# Patient Record
Sex: Male | Born: 1950 | Race: White | Hispanic: No | Marital: Married | Smoking: Former smoker
Health system: Southern US, Community
[De-identification: ages and names within clinical notes are randomized; demographics above are authoritative.]

## PROBLEM LIST (undated history)

## (undated) DIAGNOSIS — N138 Other obstructive and reflux uropathy: Secondary | ICD-10-CM

## (undated) DIAGNOSIS — Z125 Encounter for screening for malignant neoplasm of prostate: Secondary | ICD-10-CM

## (undated) DIAGNOSIS — R454 Irritability and anger: Secondary | ICD-10-CM

## (undated) DIAGNOSIS — N401 Enlarged prostate with lower urinary tract symptoms: Secondary | ICD-10-CM

## (undated) HISTORY — DX: Benign prostatic hyperplasia with lower urinary tract symptoms: N40.1

## (undated) HISTORY — DX: Irritability and anger: R45.4

## (undated) HISTORY — DX: Other obstructive and reflux uropathy: N13.8

---
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
## 1997-05-29 HISTORY — PX: INGUINAL HERNIA REPAIR: SUR1180

## 2009-05-29 HISTORY — PX: SPIROMETRY: SHX456

## 2014-07-02 LAB — BASIC METABOLIC PANEL
BUN: 12 (ref 4–21)
Creatinine: 0.8 (ref 0.6–1.3)
Glucose: 91
Potassium: 4.4 (ref 3.4–5.3)
Sodium: 140 (ref 137–147)

## 2014-07-02 LAB — COMPREHENSIVE METABOLIC PANEL
Albumin: 4.2 (ref 3.5–5.0)
Calcium: 9.2 (ref 8.7–10.7)
Globulin: 3.3

## 2014-07-02 LAB — HEPATIC FUNCTION PANEL
ALT: 25 (ref 10–40)
AST: 25 (ref 14–40)
Alkaline Phosphatase: 47 (ref 25–125)
Bilirubin, Total: 0.7

## 2014-07-02 LAB — LIPID PANEL
Cholesterol: 217 — AB (ref 0–200)
Triglycerides: 98 (ref 40–160)

## 2014-09-21 HISTORY — PX: COLONOSCOPY: SHX174

## 2014-09-21 LAB — HM COLONOSCOPY

## 2019-03-30 DIAGNOSIS — E7800 Pure hypercholesterolemia, unspecified: Secondary | ICD-10-CM

## 2019-03-30 HISTORY — DX: Pure hypercholesterolemia, unspecified: E78.00

## 2019-04-03 ENCOUNTER — Encounter: Payer: Self-pay | Admitting: Family Medicine

## 2019-04-03 ENCOUNTER — Ambulatory Visit (INDEPENDENT_AMBULATORY_CARE_PROVIDER_SITE_OTHER): Payer: Medicare Other | Admitting: Family Medicine

## 2019-04-03 ENCOUNTER — Other Ambulatory Visit: Payer: Self-pay

## 2019-04-03 VITALS — BP 114/76 | HR 76 | Temp 98.3°F | Resp 16 | Ht 68.0 in | Wt 172.8 lb

## 2019-04-03 DIAGNOSIS — Z1211 Encounter for screening for malignant neoplasm of colon: Secondary | ICD-10-CM

## 2019-04-03 DIAGNOSIS — R454 Irritability and anger: Secondary | ICD-10-CM

## 2019-04-03 DIAGNOSIS — Z0000 Encounter for general adult medical examination without abnormal findings: Secondary | ICD-10-CM

## 2019-04-03 DIAGNOSIS — Z23 Encounter for immunization: Secondary | ICD-10-CM

## 2019-04-03 DIAGNOSIS — Z125 Encounter for screening for malignant neoplasm of prostate: Secondary | ICD-10-CM

## 2019-04-03 DIAGNOSIS — F411 Generalized anxiety disorder: Secondary | ICD-10-CM | POA: Diagnosis not present

## 2019-04-03 MED ORDER — FLUOXETINE HCL 10 MG PO CAPS: 10.0000 mg | ORAL_CAPSULE | Freq: Every day | ORAL | 0 refills | Status: DC

## 2019-04-03 NOTE — Patient Instructions (Signed)

## 2019-04-03 NOTE — Progress Notes (Signed)
Office Note 04/03/2019  CC:  Chief Complaint  Patient presents with  . Establish Care    Previous PCP in [place]    HPI:  Shannon King is a 68 y.o. male who is here to establish care, discuss anxiety/anger control problems, and get annual health maintenance exam. Patient's most recent primary MD: Dr. Lily KocherBayne French in [place]. Old records were not available for review prior to or during today's visit.  Exercise: lots of exercise in yard, walks a lot on job->part time at [employer]. Diet: healthy.  C/o years of problems with "letting little things bother me a lot, holding it in and then it explodes in an angry argument with someone-usually his wife.  NO violence.  He gets irritable pretty easy.  Reports some problems with excessive worry, some guilt, some periods of feeling down but no clinical depression. Sleep is fine, appetite is fine.  Has never been on meds for anx or dep in the past. No hypomanic or manic sx's.   Past Medical History:  Diagnosis Date  . 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 COLONOSCOPY  2017   ? polypectomy->no records available  . INGUINAL HERNIA REPAIR  1999   bilat  . TONSILLECTOMY AND ADENOIDECTOMY  1965    Family History  Problem Relation Age of Onset  . Leukemia Mother   . Diabetes Mother   . Hearing loss Mother   . Hearing loss Father   . Stroke Father     Social History   Socioeconomic History  . Marital status: Married    Spouse name: Not on file  . Number of children: Not on file  . Years of education: Not on file  . Highest education level: Not on file  Occupational History  . Not on file  Social Needs  . Financial resource strain: Not on file  . Food insecurity    Worry: Not on file    Inability: Not on file  . Transportation needs    Medical: Not on file    Non-medical: Not on file  Tobacco Use  . Smoking  status: Former Smoker    Types: Cigarettes    Quit date: 04/02/2012    Years since quitting: 7.0  . Smokeless tobacco: Never Used  Substance and Sexual Activity  . Alcohol use: Not on file  . Drug use: Never  . Sexual activity: Not on file  Lifestyle  . Physical activity    Days per week: Not on file    Minutes per session: Not on file  . Stress: Not on file  Relationships  . Social Musicianconnections    Talks on phone: Not on file    Gets together: Not on file    Attends religious service: Not on file    Active member of club or organization: Not on file    Attends meetings of clubs or organizations: Not on file    Relationship status: Not on file  . Intimate partner violence    Fear of current or ex partner: Not on file    Emotionally abused: Not on file    Physically abused: Not on file    Forced sexual activity: Not on file  Other Topics Concern  . Not on file  Social History Narrative   Married, no children.   Relocated to [place] from [place] 2019.     Educ: 2  yrs college   Occup: "[employer].  Lived in [place] area in 1990s->helped build [employer] [address].   Former smoker: 20 pack-yr hx, quit 2013.   Alcohol: 2 beers per day, rare liquor.       Outpatient Encounter Medications as of 04/03/2019  Medication Sig  . Multiple Vitamins-Minerals (EMERGEN-C IMMUNE PO) Take 1,000 mg by mouth every other day.  . Omega-3 Fatty Acids (FISH OIL) 1000 MG CAPS Take by mouth daily.  . Saw Palmetto 450 MG CAPS Take by mouth 2 (two) times daily.  Marland Kitchen VITAMIN D PO Take 125 mcg by mouth daily.  Marland Kitchen FLUoxetine (PROZAC) 10 MG capsule Take 1 capsule (10 mg total) by mouth daily.   No facility-administered encounter medications on file as of 04/03/2019.     No Known Allergies  ROS Review of Systems  Constitutional: Negative for appetite change, chills, fatigue and fever.  HENT: Negative for congestion, dental problem, ear pain and sore throat.   Eyes: Negative  for discharge, redness and visual disturbance.  Respiratory: Negative for cough, chest tightness, shortness of breath and wheezing.   Cardiovascular: Negative for chest pain, palpitations and leg swelling.  Gastrointestinal: Negative for abdominal pain, blood in stool, diarrhea, nausea and vomiting.  Genitourinary: Negative for difficulty urinating, dysuria, flank pain, frequency, hematuria and urgency.  Musculoskeletal: Negative for arthralgias, back pain, joint swelling, myalgias and neck stiffness.  Skin: Negative for pallor and rash.  Neurological: Negative for dizziness, speech difficulty, weakness and headaches.  Hematological: Negative for adenopathy. Does not bruise/bleed easily.  Psychiatric/Behavioral: Negative for confusion and sleep disturbance. The patient is not nervous/anxious.     PE; Blood pressure 114/76, pulse 76, temperature 98.3 F (36.8 C), temperature source Temporal, resp. rate 16, height 5\' 8"  (1.727 m), weight 172 lb 12.8 oz (78.4 kg), SpO2 97 %. Body mass index is 26.27 kg/m.  Gen: Alert, well appearing.  Patient is oriented to person, place, time, and situation. AFFECT: pleasant, lucid thought and speech. ENT: Ears: EACs clear, normal epithelium.  TMs with good light reflex and landmarks bilaterally.  Eyes: no injection, icteris, swelling, or exudate.  EOMI, PERRLA. Nose: no drainage or turbinate edema/swelling.  No injection or focal lesion.  Mouth: lips without lesion/swelling.  Oral mucosa pink and moist.  Dentition intact and without obvious caries or gingival swelling.  Oropharynx without erythema, exudate, or swelling.  Neck: supple/nontender.  No LAD, mass, or TM.  Carotid pulses 2+ bilaterally, without bruits. CV: RRR, no m/r/g.   LUNGS: CTA bilat, nonlabored resps, good aeration in all lung fields. ABD: soft, NT, ND, BS normal.  No hepatospenomegaly or mass.  No bruits. EXT: no clubbing, cyanosis, or edema.  Musculoskeletal: no joint swelling, erythema,  warmth, or tenderness.  ROM of all joints intact. Skin - no sores or suspicious lesions or rashes or color changes Rectal: pt declined  Pertinent labs:  none  ASSESSMENT AND PLAN:   New pt; obtain prior pcp records.  1) GAD with anger control problems: start fluoxetine 10mg  qd and titrate up slowly. Therapeutic expectations and side effect profile of medication discussed today.  Patient's questions answered.  2)Health maintenance exam: Reviewed age and gender appropriate health maintenance issues (prudent diet, regular exercise, health risks of tobacco and excessive alcohol, use of seatbelts, fire alarms in home, use of sunscreen).  Also reviewed age and gender appropriate health screening as well as vaccine recommendations. Vaccines: flu vaccine today.  Determine future vaccine needs based on  what his old records have in them. Labs: fasting HP labs + PSA ordered-future when fasting. Prostate ca screening: he declined DRE today.  PSA-future. Colon ca screening: colonoscopy UTD.  Will see if his old records have anything to suggest he needs recall earlier than 10 yrs.  He is in favor of stool based testing instead of colonoscopy in future.  An After Visit Summary was printed and given to the patient.  Return in about 4 weeks (around 05/01/2019) for f/u anx/anger control probs.  Signed:  Crissie Sickles, MD           04/03/2019

## 2019-04-09 ENCOUNTER — Encounter: Payer: Self-pay | Admitting: Family Medicine

## 2019-04-11 ENCOUNTER — Ambulatory Visit (INDEPENDENT_AMBULATORY_CARE_PROVIDER_SITE_OTHER): Payer: Medicare Other | Admitting: Family Medicine

## 2019-04-11 ENCOUNTER — Other Ambulatory Visit: Payer: Self-pay

## 2019-04-11 DIAGNOSIS — Z0000 Encounter for general adult medical examination without abnormal findings: Secondary | ICD-10-CM

## 2019-04-11 NOTE — Addendum Note (Signed)
Addended by: Aashrith Eves A on: 04/11/2019 02:52 PM   Modules accepted: Orders  

## 2019-04-11 NOTE — Addendum Note (Signed)
Addended by: Cheyane Ayon A on: 04/11/2019 02:52 PM   Modules accepted: Orders  

## 2019-04-11 NOTE — Addendum Note (Signed)
Addended by: Marlene Bast A on: 04/11/2019 02:52 PM   Modules accepted: Orders

## 2019-04-11 NOTE — Addendum Note (Signed)
Addended by: Rashana Andrew A on: 04/11/2019 02:52 PM   Modules accepted: Orders  

## 2019-04-12 LAB — CBC WITH DIFFERENTIAL/PLATELET
Absolute Monocytes: 623 cells/uL (ref 200–950)
Basophils Absolute: 49 cells/uL (ref 0–200)
Basophils Relative: 0.6 %
Eosinophils Absolute: 197 cells/uL (ref 15–500)
Eosinophils Relative: 2.4 %
HCT: 41.7 % (ref 38.5–50.0)
Hemoglobin: 14 g/dL (ref 13.2–17.1)
Lymphs Abs: 3173 cells/uL (ref 850–3900)
MCH: 30.5 pg (ref 27.0–33.0)
MCHC: 33.6 g/dL (ref 32.0–36.0)
MCV: 90.8 fL (ref 80.0–100.0)
MPV: 9.3 fL (ref 7.5–12.5)
Monocytes Relative: 7.6 %
Neutro Abs: 4157 cells/uL (ref 1500–7800)
Neutrophils Relative %: 50.7 %
Platelets: 262 10*3/uL (ref 140–400)
RBC: 4.59 10*6/uL (ref 4.20–5.80)
RDW: 11.9 % (ref 11.0–15.0)
Total Lymphocyte: 38.7 %
WBC: 8.2 10*3/uL (ref 3.8–10.8)

## 2019-04-12 LAB — COMPREHENSIVE METABOLIC PANEL
AG Ratio: 1.9 (calc) (ref 1.0–2.5)
ALT: 12 U/L (ref 9–46)
AST: 15 U/L (ref 10–35)
Albumin: 4.2 g/dL (ref 3.6–5.1)
Alkaline phosphatase (APISO): 32 U/L — ABNORMAL LOW (ref 35–144)
BUN: 14 mg/dL (ref 7–25)
CO2: 26 mmol/L (ref 20–32)
Calcium: 9 mg/dL (ref 8.6–10.3)
Chloride: 105 mmol/L (ref 98–110)
Creat: 0.8 mg/dL (ref 0.70–1.25)
Globulin: 2.2 g/dL (calc) (ref 1.9–3.7)
Glucose, Bld: 85 mg/dL (ref 65–99)
Potassium: 4 mmol/L (ref 3.5–5.3)
Sodium: 139 mmol/L (ref 135–146)
Total Bilirubin: 1.1 mg/dL (ref 0.2–1.2)
Total Protein: 6.4 g/dL (ref 6.1–8.1)

## 2019-04-12 LAB — LIPID PANEL
Cholesterol: 177 mg/dL (ref <200)
HDL: 44 mg/dL (ref >=40)
LDL Cholesterol (Calc): 119 mg/dL (calc) — ABNORMAL HIGH
Non-HDL Cholesterol (Calc): 133 mg/dL (calc) — ABNORMAL HIGH (ref <130)
Total CHOL/HDL Ratio: 4 (calc) (ref <5.0)
Triglycerides: 57 mg/dL (ref <150)

## 2019-04-12 LAB — TSH: TSH: 1.14 mIU/L (ref 0.40–4.50)

## 2019-04-14 ENCOUNTER — Encounter: Payer: Self-pay | Admitting: Family Medicine

## 2019-04-16 ENCOUNTER — Encounter: Payer: Self-pay | Admitting: Family Medicine

## 2019-04-27 ENCOUNTER — Encounter: Payer: Self-pay | Admitting: Family Medicine

## 2019-04-29 DEATH — deceased

## 2019-05-05 ENCOUNTER — Encounter: Payer: Self-pay | Admitting: Family Medicine

## 2019-05-05 ENCOUNTER — Ambulatory Visit (INDEPENDENT_AMBULATORY_CARE_PROVIDER_SITE_OTHER): Payer: Medicare Other | Admitting: Family Medicine

## 2019-05-05 ENCOUNTER — Other Ambulatory Visit: Payer: Self-pay

## 2019-05-05 VITALS — BP 113/75 | HR 68 | Temp 98.2°F | Resp 16 | Ht 68.0 in | Wt 174.4 lb

## 2019-05-05 DIAGNOSIS — Z0000 Encounter for general adult medical examination without abnormal findings: Secondary | ICD-10-CM

## 2019-05-05 DIAGNOSIS — F411 Generalized anxiety disorder: Secondary | ICD-10-CM | POA: Diagnosis not present

## 2019-05-05 DIAGNOSIS — E7800 Pure hypercholesterolemia, unspecified: Secondary | ICD-10-CM

## 2019-05-05 DIAGNOSIS — R454 Irritability and anger: Secondary | ICD-10-CM | POA: Diagnosis not present

## 2019-05-05 MED ORDER — FLUOXETINE HCL 10 MG PO CAPS: 10.0000 mg | ORAL_CAPSULE | Freq: Every day | ORAL | 3 refills | Status: DC

## 2019-05-05 NOTE — Progress Notes (Signed)
OFFICE VISIT  05/05/2019   CC:  Chief Complaint  Patient presents with  . Follow-up    anxiety, anger problems    HPI:    Patient is a 68 y.o. Caucasian male who presents for 1 mo f/u anxiety with significant irritability and difficulty controlling anger. A/P as of last visit:  "1) GAD with anger control problems: start fluoxetine 10mg  qd and titrate up slowly. Therapeutic expectations and side effect profile of medication discussed today.  Patient's questions answered.  2)Health maintenance exam: Reviewed age and gender appropriate health maintenance issues (prudent diet, regular exercise, health risks of tobacco and excessive alcohol, use of seatbelts, fire alarms in home, use of sunscreen).  Also reviewed age and gender appropriate health screening as well as vaccine recommendations. Vaccines: flu vaccine today.  Determine future vaccine needs based on what his old records have in them. Labs: fasting HP labs + PSA ordered-future when fasting. Prostate ca screening: he declined DRE today.  PSA-future. Colon ca screening: colonoscopy UTD.  Will see if his old records have anything to suggest he needs recall earlier than 10 yrs.  He is in favor of stool based testing instead of colonoscopy in future".  Interim hx: DOING ALOT BETTER! Less irritable, getting in less arguments with wife, calmer, not worrying excessively or being overcritical of others as much as he used to be.  No adverse side effects from fluoxetine. He wants to stay on current dose.  Reviewed all recent lab results in detail with pt again today: mild HLD, 10 yr Framingham risk elevated and statin indicated but he declines, preferring TLC, already making some dietary changes.   Past Medical History:  Diagnosis Date  . 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 Hypercholesterolemia 03/2019   pt declined statin trial 03/2019  . Prostate cancer screening    pt declined  screening 03/2019    Past Surgical History:  Procedure Laterality Date  . COLONOSCOPY  09/21/2014   adenomas->recall 3-5 yrs  . INGUINAL HERNIA REPAIR  1999   bilat  . SPIROMETRY  2011   Normal  . TONSILLECTOMY AND ADENOIDECTOMY  1965    Outpatient Medications Prior to Visit  Medication Sig Dispense Refill  . Multiple Vitamins-Minerals (EMERGEN-C IMMUNE PO) Take 1,000 mg by mouth every other day.    . Omega-3 Fatty Acids (FISH OIL) 1000 MG CAPS Take by mouth daily.    . Saw Palmetto 450 MG CAPS Take by mouth 2 (two) times daily.    2012 VITAMIN D PO Take 125 mcg by mouth daily.    Marland Kitchen FLUoxetine (PROZAC) 10 MG capsule Take 1 capsule (10 mg total) by mouth daily. 30 capsule 0   No facility-administered medications prior to visit.     No Known Allergies  ROS As per HPI  PE: Blood pressure 113/75, pulse 68, temperature 98.2 F (36.8 C), temperature source Temporal, resp. rate 16, height 5\' 8"  (1.727 m), weight 174 lb 6.4 oz (79.1 kg), SpO2 97 %. Gen: Alert, well appearing.  Patient is oriented to person, place, time, and situation. AFFECT: pleasant, lucid thought and speech. No further exam today.  LABS:  Lab Results  Component Value Date   TSH 1.14 04/11/2019   Lab Results  Component Value Date   WBC 8.2 04/11/2019   HGB 14.0 04/11/2019   HCT 41.7 04/11/2019   MCV 90.8 04/11/2019   PLT 262 04/11/2019   Lab Results  Component Value  Date   CREATININE 0.80 04/11/2019   BUN 14 04/11/2019   NA 139 04/11/2019   K 4.0 04/11/2019   CL 105 04/11/2019   CO2 26 04/11/2019   Lab Results  Component Value Date   ALT 12 04/11/2019   AST 15 04/11/2019   ALKPHOS 47 07/02/2014   BILITOT 1.1 04/11/2019   Lab Results  Component Value Date   CHOL 177 04/11/2019   Lab Results  Component Value Date   HDL 44 04/11/2019   Lab Results  Component Value Date   LDLCALC 119 (H) 04/11/2019   Lab Results  Component Value Date   TRIG 57 04/11/2019   Lab Results  Component  Value Date   CHOLHDL 4.0 04/11/2019   IMPRESSION AND PLAN:  1) GAD/difficulty controlling anger: much improved. Continue fluox 10mg  qd. Pt prefers to call and let me know if current dose becomes insufficieny or if having adverse side effects and I'm ok with this.  2) HLD: he declined statin. Encouraged him to continue efforts at [facility].  3) Preventative healthcare: he declines Tdap and Pneumovax today.  An After Visit Summary was printed and given to the patient.  FOLLOW UP: Return in about 1 year (around 05/04/2020) for annual CPE (fasting).  Signed:  Crissie Sickles, MD           05/05/2019

## 2019-06-27 ENCOUNTER — Ambulatory Visit: Payer: Medicare Other

## 2019-07-05 ENCOUNTER — Ambulatory Visit: Payer: Medicare Other | Attending: Internal Medicine

## 2019-07-05 DIAGNOSIS — Z23 Encounter for immunization: Secondary | ICD-10-CM

## 2019-07-05 NOTE — Progress Notes (Signed)
   Covid-19 Vaccination Clinic  Name:  Shannon King    MRN: [id number] DOB: [birth year]  07/05/2019  [name] was observed post Covid-19 immunization for 15 minutes without incidence. He was provided with Vaccine Information Sheet and instruction to access the V-Safe system.   [name] was instructed to call 911 with any severe reactions post vaccine: Marland Kitchen Difficulty breathing  . Swelling of your face and throat  . A fast heartbeat  . A bad rash all over your body  . Dizziness and weakness    Immunizations Administered    Name Date Dose VIS Date Route   Pfizer COVID-19 Vaccine 07/05/2019 11:35 AM 0.3 mL 05/09/2019 Intramuscular   Manufacturer: [employer], [employer]   Lot: [id number]   NDC: [address]-7

## 2019-07-30 ENCOUNTER — Ambulatory Visit: Payer: Medicare Other | Attending: Internal Medicine

## 2019-07-30 DIAGNOSIS — Z23 Encounter for immunization: Secondary | ICD-10-CM | POA: Insufficient documentation

## 2019-07-30 NOTE — Progress Notes (Signed)
   Covid-19 Vaccination Clinic  Name:  Shannon King    MRN: [id number] DOB: [birth year]  07/30/2019  [name] was observed post Covid-19 immunization for 15 minutes without incident. He was provided with Vaccine Information Sheet and instruction to access the V-Safe system.   [name] was instructed to call 911 with any severe reactions post vaccine: Marland Kitchen Difficulty breathing  . Swelling of face and throat  . A fast heartbeat  . A bad rash all over body  . Dizziness and weakness   Immunizations Administered    Name Date Dose VIS Date Route   Pfizer COVID-19 Vaccine 07/30/2019  9:07 AM 0.3 mL 05/09/2019 Intramuscular   Manufacturer: [employer], [employer]   Lot: [id number]   NDC: [address]-7

## 2020-03-05 ENCOUNTER — Ambulatory Visit: Payer: Medicare Other | Attending: Internal Medicine

## 2020-03-05 DIAGNOSIS — Z23 Encounter for immunization: Secondary | ICD-10-CM

## 2020-03-05 NOTE — Progress Notes (Signed)
   Covid-19 Vaccination Clinic  Name:  Shannon King    MRN: [id number] DOB: 1951-02-05  03/05/2020  [name] was observed post Covid-19 immunization for 15 minutes without incident. He was provided with Vaccine Information Sheet and instruction to access the V-Safe system.   [name] was instructed to call 911 with any severe reactions post vaccine: Marland Kitchen Difficulty breathing  . Swelling of face and throat  . A fast heartbeat  . A bad rash all over body  . Dizziness and weakness

## 2020-04-11 ENCOUNTER — Other Ambulatory Visit: Payer: Self-pay | Admitting: Family Medicine

## 2020-04-20 ENCOUNTER — Other Ambulatory Visit: Payer: Medicare Other

## 2020-04-28 ENCOUNTER — Encounter: Payer: Medicare Other | Admitting: Family Medicine

## 2020-05-04 ENCOUNTER — Encounter: Payer: Medicare Other | Admitting: Family Medicine

## 2020-05-11 ENCOUNTER — Other Ambulatory Visit: Payer: Self-pay | Admitting: Family Medicine

## 2020-05-17 ENCOUNTER — Other Ambulatory Visit: Payer: Self-pay

## 2020-05-18 ENCOUNTER — Encounter: Payer: Self-pay | Admitting: Family Medicine

## 2020-05-18 ENCOUNTER — Ambulatory Visit (INDEPENDENT_AMBULATORY_CARE_PROVIDER_SITE_OTHER): Payer: Medicare Other | Admitting: Family Medicine

## 2020-05-18 VITALS — BP 103/65 | HR 76 | Temp 98.3°F | Resp 16 | Ht 68.0 in | Wt 173.8 lb

## 2020-05-18 DIAGNOSIS — Z125 Encounter for screening for malignant neoplasm of prostate: Secondary | ICD-10-CM

## 2020-05-18 DIAGNOSIS — E7800 Pure hypercholesterolemia, unspecified: Secondary | ICD-10-CM

## 2020-05-18 DIAGNOSIS — Z23 Encounter for immunization: Secondary | ICD-10-CM | POA: Diagnosis not present

## 2020-05-18 DIAGNOSIS — Z0000 Encounter for general adult medical examination without abnormal findings: Secondary | ICD-10-CM

## 2020-05-18 LAB — COMPREHENSIVE METABOLIC PANEL
ALT: 16 U/L (ref 0–53)
AST: 15 U/L (ref 0–37)
Albumin: 4.2 g/dL (ref 3.5–5.2)
Alkaline Phosphatase: 42 U/L (ref 39–117)
BUN: 10 mg/dL (ref 6–23)
CO2: 29 mEq/L (ref 19–32)
Calcium: 9 mg/dL (ref 8.4–10.5)
Chloride: 102 mEq/L (ref 96–112)
Creatinine, Ser: 0.73 mg/dL (ref 0.40–1.50)
GFR: 94.34 mL/min (ref >60.00)
Glucose, Bld: 87 mg/dL (ref 70–99)
Potassium: 4.3 mEq/L (ref 3.5–5.1)
Sodium: 136 mEq/L (ref 135–145)
Total Bilirubin: 0.7 mg/dL (ref 0.2–1.2)
Total Protein: 6.8 g/dL (ref 6.0–8.3)

## 2020-05-18 LAB — CBC WITH DIFFERENTIAL/PLATELET
Basophils Absolute: 0 10*3/uL (ref 0.0–0.1)
Basophils Relative: 0.6 % (ref 0.0–3.0)
Eosinophils Absolute: 0.3 10*3/uL (ref 0.0–0.7)
Eosinophils Relative: 4.3 % (ref 0.0–5.0)
HCT: 43.8 % (ref 39.0–52.0)
Hemoglobin: 14.8 g/dL (ref 13.0–17.0)
Lymphocytes Relative: 36.8 % (ref 12.0–46.0)
Lymphs Abs: 2.6 10*3/uL (ref 0.7–4.0)
MCHC: 33.8 g/dL (ref 30.0–36.0)
MCV: 92.4 fl (ref 78.0–100.0)
Monocytes Absolute: 0.6 10*3/uL (ref 0.1–1.0)
Monocytes Relative: 8.6 % (ref 3.0–12.0)
Neutro Abs: 3.5 10*3/uL (ref 1.4–7.7)
Neutrophils Relative %: 49.7 % (ref 43.0–77.0)
Platelets: 262 10*3/uL (ref 150.0–400.0)
RBC: 4.74 Mil/uL (ref 4.22–5.81)
RDW: 13.1 % (ref 11.5–15.5)
WBC: 7 10*3/uL (ref 4.0–10.5)

## 2020-05-18 LAB — LIPID PANEL
Cholesterol: 185 mg/dL (ref 0–200)
HDL: 46.9 mg/dL (ref >39.00)
LDL Cholesterol: 121 mg/dL — ABNORMAL HIGH (ref 0–99)
NonHDL: 138.13
Total CHOL/HDL Ratio: 4
Triglycerides: 85 mg/dL (ref 0.0–149.0)
VLDL: 17 mg/dL (ref 0.0–40.0)

## 2020-05-18 LAB — PSA, MEDICARE: PSA: 0.77 ng/ml (ref 0.10–4.00)

## 2020-05-18 MED ORDER — FLUOXETINE HCL 10 MG PO CAPS: ORAL_CAPSULE | ORAL | 3 refills | Status: DC

## 2020-05-18 NOTE — Progress Notes (Signed)
Office Note 05/18/2020  CC:  Chief Complaint  Patient presents with  . Annual Exam    Pt is fasting    HPI:  Shannon King is a 68 y.o. male who is here for annual health maintenance exam and f/u irritability and anger control difficulties. His irritability and anger control problems had improved with getting on fluox 10mg  qd as of his last visit with me 1 yr ago.  INTERIM HX: Doing well regarding irritability, anger problems, anxiety, mood. Taking prozac 10mg  every day and desires to continue this dose indefinitely at this point. No adverse side effects.  Quit working for about a month ago, had differences with . He went back to working for [employer] and likes it: makes more money and works shorter hours.   Past Medical History:  Diagnosis Date  . 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 Hypercholesterolemia 03/2019   pt declined statin trial 03/2019  . Prostate cancer screening    pt declined screening 03/2019    Past Surgical History:  Procedure Laterality Date  . COLONOSCOPY  09/21/2014   adenomas->recall 3-5 yrs  . INGUINAL HERNIA REPAIR  1999   bilat  . SPIROMETRY  2011   Normal  . TONSILLECTOMY AND ADENOIDECTOMY  1965    Family History  Problem Relation Age of Onset  . Leukemia Mother   . Diabetes Mother   . Hearing loss Mother   . Hearing loss Father   . Stroke Father   . Diabetes Father   . Lung cancer Maternal Grandfather   . Diabetes Paternal Grandfather     Social History   Socioeconomic History  . Marital status: Married    Spouse name: Not on file  . Number of children: Not on file  . Years of education: Not on file  . Highest education level: Not on file  Occupational History  . Not on file  Tobacco Use  . Smoking status: Former Smoker    Types: Cigarettes    Quit date: 04/02/2012    Years since quitting: 8.1  . Smokeless tobacco: Never Used   Vaping Use  . Vaping Use: Never used  Substance and Sexual Activity  . Alcohol use: Not on file  . Drug use: Never  . Sexual activity: Not on file  Other Topics Concern  . Not on file  Social History Narrative   Married, no children.   Relocated to [place] from 2012 2019.     Educ: 2 yrs college   Occup: "[employer] [place].  Lived in [place] area in 1990s->helped build [employer] [address].   Former smoker: 20 pack-yr hx, quit 2013.   Alcohol: 2 beers per day, rare liquor.      Social Determinants of Health   Financial Resource Strain: Not on file  Food Insecurity: Not on file  Transportation Needs: Not on file  Physical Activity: Not on file  Stress: Not on file  Social Connections: Not on file  Intimate Partner Violence: Not on file    Outpatient Medications Prior to Visit  Medication Sig Dispense Refill  . Multiple Vitamins-Minerals (EMERGEN-C IMMUNE PO) Take 1,000 mg by mouth every other day.    . Omega-3 Fatty Acids (FISH OIL) 1000 MG CAPS Take by mouth daily.    . Saw Palmetto 450 MG CAPS Take by mouth 2 (two) times daily.    [place] VITAMIN D PO  Take 125 mcg by mouth daily.    Marland Kitchen FLUoxetine (PROZAC) 10 MG capsule TAKE 1 CAPSULE BY MOUTH EVERY DAY 30 capsule 0   No facility-administered medications prior to visit.    No Known Allergies  ROS Review of Systems  Constitutional: Negative for appetite change, chills, fatigue and fever.  HENT: Negative for congestion, dental problem, ear pain and sore throat.   Eyes: Negative for discharge, redness and visual disturbance.  Respiratory: Negative for cough, chest tightness, shortness of breath and wheezing.   Cardiovascular: Negative for chest pain, palpitations and leg swelling.  Gastrointestinal: Negative for abdominal pain, blood in stool, diarrhea, nausea and vomiting.  Genitourinary: Negative for difficulty urinating, dysuria, flank pain, frequency, hematuria and urgency.  Musculoskeletal:  Negative for arthralgias, back pain, joint swelling, myalgias and neck stiffness.  Skin: Negative for pallor and rash.  Neurological: Negative for dizziness, speech difficulty, weakness and headaches.  Hematological: Negative for adenopathy. Does not bruise/bleed easily.  Psychiatric/Behavioral: Negative for confusion and sleep disturbance. The patient is not nervous/anxious.     PE; Vitals with BMI 05/18/2020 05/05/2019 04/03/2019  Height 5\' 8"  5\' 8"  5\' 8"   Weight 173 lbs 13 oz 174 lbs 6 oz 172 lbs 13 oz  BMI 26.43 26.52 26.28  Systolic 103 113  Diastolic 65 75 76  Pulse 76 68 76     Gen: Alert, well appearing.  Patient is oriented to person, place, time, and situation. AFFECT: pleasant, lucid thought and speech. ENT: Ears: EACs clear, normal epithelium.  TMs with good light reflex and landmarks bilaterally.  Eyes: no injection, icteris, swelling, or exudate.  EOMI, PERRLA. Nose: no drainage or turbinate edema/swelling.  No injection or focal lesion.  Mouth: lips without lesion/swelling.  Oral mucosa pink and moist.  Dentition intact and without obvious caries or gingival swelling.  Oropharynx without erythema, exudate, or swelling.  Neck: supple/nontender.  No LAD, mass, or TM.  Carotid pulses 2+ bilaterally, without bruits. CV: RRR, no m/r/g.   LUNGS: CTA bilat, nonlabored resps, good aeration in all lung fields. ABD: soft, NT, ND, BS normal.  No hepatospenomegaly or mass.  No bruits. EXT: no clubbing, cyanosis, or edema.  Musculoskeletal: no joint swelling, erythema, warmth, or tenderness.  ROM of all joints intact. Skin - no sores or suspicious lesions or rashes or color changes   Pertinent labs:  Lab Results  Component Value Date   TSH 1.14 04/11/2019   Lab Results  Component Value Date   WBC 8.2 04/11/2019   HGB 14.0 04/11/2019   HCT 41.7 04/11/2019   MCV 90.8 04/11/2019   PLT 262 04/11/2019   Lab Results  Component Value Date   CREATININE 0.80 04/11/2019   BUN  14 04/11/2019   NA 139 04/11/2019   K 4.0 04/11/2019   CL 105 04/11/2019   CO2 26 04/11/2019   Lab Results  Component Value Date   ALT 12 04/11/2019   AST 15 04/11/2019   ALKPHOS 47 07/02/2014   BILITOT 1.1 04/11/2019   Lab Results  Component Value Date   CHOL 177 04/11/2019   Lab Results  Component Value Date   HDL 44 04/11/2019   Lab Results  Component Value Date   LDLCALC 119 (H) 04/11/2019   Lab Results  Component Value Date   TRIG 57 04/11/2019   Lab Results  Component Value Date   CHOLHDL 4.0 04/11/2019    ASSESSMENT AND PLAN:   1) Anger control difficulties, irritability, anxiety->all better and well  controlled on fluoxetine 10mg  qd long term. Continue this->RFs eRx'd today.  2) Health maintenance exam: Reviewed age and gender appropriate health maintenance issues (prudent diet, regular exercise, health risks of tobacco and excessive alcohol, use of seatbelts, fire alarms in home, use of sunscreen).  Also reviewed age and gender appropriate health screening as well as vaccine recommendations. Vaccines: Flu vaccine->given today.  Covid->all UTD.  Pt declined pneumonia vaccines and Tdap in the past-->pt declined again today. Labs: CBC, CMET, FLP  (HLD). Prostate ca screening: pt declined in 2020->he will get PSA today. Colon ca screening: adenomas 2016, due for recall->pt declines any further scopes.  An After Visit Summary was printed and given to the patient.  FOLLOW UP:  Return in about 1 year (around 05/18/2021) for annual CPE (fasting).  Signed:  05/20/2021, MD           05/18/2020

## 2020-05-18 NOTE — Patient Instructions (Signed)

## 2020-05-20 ENCOUNTER — Encounter: Payer: Self-pay | Admitting: Family Medicine

## 2020-08-30 DIAGNOSIS — H31091 Other chorioretinal scars, right eye: Secondary | ICD-10-CM | POA: Diagnosis not present

## 2020-08-30 DIAGNOSIS — H16223 Keratoconjunctivitis sicca, not specified as Sjogren's, bilateral: Secondary | ICD-10-CM | POA: Diagnosis not present

## 2020-08-30 DIAGNOSIS — H43813 Vitreous degeneration, bilateral: Secondary | ICD-10-CM | POA: Diagnosis not present

## 2020-08-30 DIAGNOSIS — H2513 Age-related nuclear cataract, bilateral: Secondary | ICD-10-CM | POA: Diagnosis not present

## 2020-12-05 DIAGNOSIS — H60502 Unspecified acute noninfective otitis externa, left ear: Secondary | ICD-10-CM | POA: Diagnosis not present

## 2021-04-20 ENCOUNTER — Telehealth: Payer: Self-pay | Admitting: Family Medicine

## 2021-04-20 DIAGNOSIS — S92414A Nondisplaced fracture of proximal phalanx of right great toe, initial encounter for closed fracture: Secondary | ICD-10-CM | POA: Diagnosis not present

## 2021-04-20 NOTE — Telephone Encounter (Signed)
Pt called stating he broke his toe... would like recommendations for an orthopedic surgeon.

## 2021-04-24 ENCOUNTER — Other Ambulatory Visit (HOSPITAL_BASED_OUTPATIENT_CLINIC_OR_DEPARTMENT_OTHER): Payer: Self-pay

## 2021-04-24 ENCOUNTER — Other Ambulatory Visit: Payer: Self-pay

## 2021-04-24 ENCOUNTER — Encounter (HOSPITAL_BASED_OUTPATIENT_CLINIC_OR_DEPARTMENT_OTHER): Payer: Self-pay | Admitting: Emergency Medicine

## 2021-04-24 ENCOUNTER — Emergency Department (HOSPITAL_BASED_OUTPATIENT_CLINIC_OR_DEPARTMENT_OTHER): Payer: Medicare Other

## 2021-04-24 ENCOUNTER — Emergency Department (HOSPITAL_BASED_OUTPATIENT_CLINIC_OR_DEPARTMENT_OTHER)
Admission: EM | Admit: 2021-04-24 | Discharge: 2021-04-24 | Disposition: A | Discharge status: Home / Self Care | Payer: Medicare Other | Attending: Emergency Medicine | Admitting: Emergency Medicine

## 2021-04-24 DIAGNOSIS — W01198A Fall on same level from slipping, tripping and stumbling with subsequent striking against other object, initial encounter: Secondary | ICD-10-CM | POA: Diagnosis not present

## 2021-04-24 DIAGNOSIS — S92314A Nondisplaced fracture of first metatarsal bone, right foot, initial encounter for closed fracture: Secondary | ICD-10-CM | POA: Insufficient documentation

## 2021-04-24 DIAGNOSIS — S92411A Displaced fracture of proximal phalanx of right great toe, initial encounter for closed fracture: Secondary | ICD-10-CM | POA: Diagnosis not present

## 2021-04-24 DIAGNOSIS — Z87891 Personal history of nicotine dependence: Secondary | ICD-10-CM | POA: Diagnosis not present

## 2021-04-24 DIAGNOSIS — S99921A Unspecified injury of right foot, initial encounter: Secondary | ICD-10-CM | POA: Diagnosis present

## 2021-04-24 MED ORDER — CEPHALEXIN 500 MG PO CAPS: 1000.0000 mg | ORAL_CAPSULE | Freq: Two times a day (BID) | ORAL | 0 refills | Status: AC

## 2021-04-24 NOTE — ED Triage Notes (Signed)
Pt c/o blood blister noted to right great toe. Pt has fracture to toe noted 04/20/2021. Pt had a trip and fall with foot hitting into a retaining wall. Pt seen at [facility] where he had x-rays done and was given a walking boot. Pt unable to tolerate walking boot, buddy taped toe instead. Pt concerned about wound infection.

## 2021-04-24 NOTE — Discharge Instructions (Signed)
Return for rapid spreading redness or develop a fever.  Please follow-up with orthopedic doctor in the office.

## 2021-04-24 NOTE — ED Provider Notes (Signed)
[facility] Provider Note   CSN: [id number] Arrival date & time: 04/24/21  1229     History Chief Complaint  Patient presents with   Wound Check    Shannon King is a 70 y.o. male.  70 yo M with a chief complaints of great toe pain.  The patient unfortunately was in an injury that resulted in a toe fracture.  This was noted at an urgent care center.  He had a blister to that toe that ruptured this morning.  He is concerned about possible infection and so came here for evaluation.  He does not like the boot that they gave him and has been buddy taping the area.  He denies any trauma.  Denies fevers or chills.  The history is provided by the patient.  Wound Check Pertinent negatives include no chest pain, no abdominal pain, no headaches and no shortness of breath.  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   Past Surgical History:  Procedure Laterality Date   COLONOSCOPY  09/21/2014   adenomas->recall 3-5 yrs   INGUINAL HERNIA REPAIR  1999   bilat   SPIROMETRY  2011   Normal   TONSILLECTOMY AND ADENOIDECTOMY  1965       Family History  Problem Relation Age of Onset   Leukemia Mother    Diabetes Mother    Hearing loss Mother    Hearing loss Father    Stroke Father    Diabetes Father    Lung cancer Maternal Grandfather    Diabetes Paternal Grandfather     Social History   Tobacco Use   Smoking status: Former     Types: Cigarettes    Quit date: 04/02/2012    Years since quitting: 9.0   Smokeless tobacco: Never  Vaping Use   Vaping Use: Never used  Substance Use Topics   Alcohol use: Yes    Alcohol/week: 1.0 - 2.0 standard drink    Types: 1 - 2 Cans of beer per week   Drug use: Never    Home Medications Prior to Admission medications   Medication Sig Start Date End Date Taking? Authorizing Provider  cephALEXin (KEFLEX) 500 MG capsule Take 2 capsules (1,000 mg total) by mouth 2 (two) times daily for 7 days. 04/24/21 05/01/21 Yes Deno Etienne, DO  FLUoxetine (PROZAC) 10 MG capsule TAKE 1 CAPSULE BY MOUTH EVERY DAY 05/18/20   McGowen, Adrian Blackwater, MD  Multiple Vitamins-Minerals (EMERGEN-C IMMUNE PO) Take 1,000 mg by mouth every other day.    [provider]  Omega-3 Fatty Acids (FISH OIL) 1000 MG CAPS Take by mouth daily.    [provider]  Saw Palmetto 450 MG CAPS Take by mouth 2 (two) times daily.    [provider]  VITAMIN D PO Take 125 mcg by mouth daily.    [provider]    Allergies    Patient has no known allergies.  Review of Systems   Review of Systems  Constitutional:  Negative for chills and fever.  HENT:  Negative for congestion and facial swelling.   Eyes:  Negative for discharge and visual disturbance.  Respiratory:  Negative for shortness of breath.   Cardiovascular:  Negative for chest pain and palpitations.  Gastrointestinal:  Negative for abdominal pain, diarrhea and vomiting.  Musculoskeletal:  Negative for arthralgias and myalgias.  Skin:  Positive for color change and wound. Negative for rash.  Neurological:  Negative for tremors, syncope and headaches.  Psychiatric/Behavioral:  Negative for confusion and dysphoric mood.    Physical Exam Updated Vital Signs BP (!) 130/95   Pulse 94   Temp 98 F (36.7 C)   Resp 16   Ht 5\' 10"  (1.778 m)   Wt 79.4 kg   SpO2 96%   BMI 25.11 kg/m   Physical Exam Vitals and nursing note  reviewed.  Constitutional:      Appearance: He is well-developed.  HENT:     Head: Normocephalic and atraumatic.  Eyes:     Pupils: Pupils are equal, round, and reactive to light.  Neck:     Vascular: No JVD.  Cardiovascular:     Rate and Rhythm: Normal rate and regular rhythm.     Heart sounds: No murmur heard.   No friction rub. No gallop.  Pulmonary:     Effort: No respiratory distress.     Breath sounds: No wheezing.  Abdominal:     General: There is no distension.     Tenderness: There is no abdominal tenderness. There is no guarding or rebound.  Musculoskeletal:        General: Swelling and tenderness present. Normal range of motion.     Cervical back: Normal range of motion and neck supple.     Comments: Pain and swelling to the toes, mild pain to the great toe.  Eroded blister to the medial aspect of the toe.  Mild erythema no obvious drainage.   Skin:    Coloration: Skin is not pale.     Findings: No rash.  Neurological:     Mental Status: He is alert and oriented to person, place, and time.  Psychiatric:        Behavior: Behavior normal.    ED Results / Procedures / Treatments   Labs (all labs ordered are listed, but only abnormal results are displayed) Labs Reviewed - No data to display  EKG None  Radiology No results found.  Procedures Procedures   Medications Ordered in ED Medications - No data to display  ED Course  I have reviewed the triage vital signs and the nursing notes.  Pertinent labs & imaging results that were available during my care of the patient were reviewed by me and considered in my medical decision making (see chart for details).    MDM Rules/Calculators/A&P                           70 yo M with a chief complaints of great toe injury.  This happened about a week ago.  The patient was seen at an urgent care center and was diagnosed with a proximal first metatarsal fracture.  He was placed in a cam walker and given some  follow-up options however he has not followed up with a doctor yet.  He tells me he could not call any because it was over the holiday weekend.  He had a  blister to the area that has eroded and now ruptured.  He is concerned for possible infection.  Not obviously infected on my exam.  There is some mild erythema that I think likely is due to swelling from the fracture but would consider starting him on antibiotics.  Plain film unable to be viewed from outside facility so repeat film here today consistent with a proximal first metatarsal fracture.  We will give a postop shoe.  Orthopedic follow-up.  2:55 PM:  I have discussed the diagnosis/risks/treatment options with the patient and believe the pt to be eligible for discharge home to follow-up with Ortho. We also discussed returning to the ED immediately if new or worsening sx occur. We discussed the sx which are most concerning (e.g., sudden worsening pain, fever, inability to tolerate by mouth) that necessitate immediate return. Medications administered to the patient during their visit and any new prescriptions provided to the patient are listed below.  Medications given during this visit Medications - No data to display   The patient appears reasonably screen and/or stabilized for discharge and I doubt any other medical condition or other [facility] requiring further screening, evaluation, or treatment in the ED at this time prior to discharge.    Final Clinical Impression(s) / ED Diagnoses Final diagnoses:  Nondisplaced fracture of first metatarsal bone, right foot, initial encounter for closed fracture    Rx / DC Orders ED Discharge Orders          Ordered    cephALEXin (KEFLEX) 500 MG capsule  2 times daily        04/24/21 1454             Melene Plan, DO 04/24/21 1455

## 2021-04-24 NOTE — ED Notes (Signed)
Post op shoe and bandage applied to right great toe/foot.

## 2021-04-25 ENCOUNTER — Ambulatory Visit (INDEPENDENT_AMBULATORY_CARE_PROVIDER_SITE_OTHER): Payer: Medicare Other | Admitting: Family Medicine

## 2021-04-25 ENCOUNTER — Encounter: Payer: Self-pay | Admitting: Family Medicine

## 2021-04-25 ENCOUNTER — Ambulatory Visit: Payer: Medicare Other | Admitting: Family Medicine

## 2021-04-25 VITALS — Ht 70.0 in | Wt 184.4 lb

## 2021-04-25 DIAGNOSIS — S92411D Displaced fracture of proximal phalanx of right great toe, subsequent encounter for fracture with routine healing: Secondary | ICD-10-CM | POA: Diagnosis not present

## 2021-04-25 NOTE — Telephone Encounter (Signed)
LVM for pt to CB regarding toe and to sched and appt.

## 2021-04-25 NOTE — Progress Notes (Signed)
See student note for this encounter. I have attested it. Signed:  Phil Beza Steppe, MD           04/25/2021  

## 2021-04-25 NOTE — Progress Notes (Addendum)
OFFICE VISIT  04/25/2021  CC:  Chief Complaint  Patient presents with   Follow-up    Hosp f/u. Pt fractured right big toe    HPI:    Patient is a 70 y.o. male who presents for right big toe pain. Patient tripped and fell down a hill a couple of days ago, which led to him to visit the emergency department. There he was diagnosed with multiple fractures of the proximal phalanx of the R great toe. Patient keeps his toe wrapped and wears a foot brace for ambulation. He used neosporin for skin healing, Hemp oil for pain, and he is taking prophylactic cephalexin 500 mg bid. He is currently not in much pain at all. Patient determined to reach out to an orthopedic specialist. The patient is not in any acute distress. ROS negative (cough, congestion, fever).   INTERIM HX:  Foot x-ray 04/24/21 (yesterday): IMPRESSION: 1. Mildly comminuted, minimally displaced fracture of the proximal phalanx of the great toe, within an interarticular component at the MTP joint. 2. Small nondisplaced non comminuted fracture at the lateral base of the distal phalanx of the great toe. 3. No other fractures and no dislocation.  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 28 capsule 0   FLUoxetine (PROZAC) 10 MG capsule TAKE 1 CAPSULE BY MOUTH EVERY DAY 90 capsule 3   Omega-3 Fatty Acids (FISH OIL) 1000 MG CAPS Take by mouth daily.     Saw Palmetto 450  MG CAPS Take by mouth 2 (two) times daily.     VITAMIN D PO Take 125 mcg by mouth daily.     Multiple Vitamins-Minerals (EMERGEN-C IMMUNE PO) Take 1,000 mg by mouth every other day. (Patient not taking: Reported on 04/25/2021)     No facility-administered medications prior to visit.    No Known Allergies  ROS As per HPI  PE: Vitals with BMI 04/25/2021 04/24/2021 05/18/2020  Height 5\' 10"  5\' 10"  5\' 8"   Weight 184 lbs 6 oz 175 lbs 173 lbs 13 oz  BMI 26.46 25.11 26.43  Systolic - 130 103  Diastolic - 95 65  Pulse - 94 76   Physical Exam Constitutional:      Appearance: Normal appearance.  Pulmonary:     Effort: Pulmonary effort is normal.  Skin:    Comments: Erythema and edema present on distal phalanges on R foot. Of note, there is desquamation medially of his 1st phalanx. No exudate present.  Neurological:     Mental Status: He is alert.  Foot warm.  He can move toe wnl.  LABS:    Chemistry      Component Value Date/Time   NA 136 05/18/2020 1135   NA 140 07/02/2014 0000   K 4.3 05/18/2020 1135   CL 102 05/18/2020 1135   CO2 29 05/18/2020 1135   BUN 10 05/18/2020 1135  BUN 12 07/02/2014 0000   CREATININE 0.73 05/18/2020 1135   CREATININE 0.80 04/11/2019 1500   GLU 91 07/02/2014 0000      Component Value Date/Time   CALCIUM 9.0 05/18/2020 1135   ALKPHOS 42 05/18/2020 1135   AST 15 05/18/2020 1135   ALT 16 05/18/2020 1135   BILITOT 0.7 05/18/2020 1135       IMPRESSION AND PLAN:  Non-toxic appearing male who presents for follow up for a slightly displaced and closed proximal phalanx fracture of his R great toe.  Patients condition is stable given his pain is controlled and wound is healing appropriately. Continue post-op shoe, Hemp oil for pain, Cephalexin for infection prophylaxis given his ruptured vesicle, and neosporin for wound healing. Patient has been referred to Orthopedics for further management. Patient instructed to reach out if pain intensifies or if  fever occurs.  An After Visit Summary was printed and given to the patient.  FOLLOW UP: if not improving and if unable to get in to ortho  Clarita Crane -MS3  I personally was present during the history, physical exam, and medical decision-making activities of this service and have verified that the service and findings are accurately documented in the student's note.  Signed:  Santiago Bumpers, MD           04/25/2021

## 2021-04-25 NOTE — Patient Instructions (Signed)
If orthopedist office doesn't call you by tomorrow at 3 PM then call them at [phone].

## 2021-04-26 NOTE — Telephone Encounter (Signed)
Referral placed and pt has appt with ortho on 12/1

## 2021-04-28 ENCOUNTER — Encounter: Payer: Self-pay | Admitting: Orthopedic Surgery

## 2021-04-28 ENCOUNTER — Ambulatory Visit: Payer: Medicare Other | Admitting: Orthopedic Surgery

## 2021-04-28 ENCOUNTER — Other Ambulatory Visit: Payer: Self-pay

## 2021-04-28 DIAGNOSIS — S92414A Nondisplaced fracture of proximal phalanx of right great toe, initial encounter for closed fracture: Secondary | ICD-10-CM

## 2021-04-28 NOTE — Progress Notes (Signed)
Office Visit Note   Patient: Shannon King           Date of Birth: 1950-10-22           MRN: [id number] Visit Date: 04/28/2021              Requested by: Jeoffrey Massed, MD [address] [place],  [place] [address] PCP: Jeoffrey Massed, MD  Chief Complaint  Patient presents with   Right Foot - Fracture    Right Great toe fracture 04/20/21 went to ED for f/u, had xrays done      HPI: Patient is a 70 year old gentleman who was seen for initial evaluation for a closed fracture of the base of the first metatarsal and tuft of the right great toe.  Patient was initially placed in a fracture boot and is currently ambulating in a postoperative shoe.  Assessment & Plan: Visit Diagnoses:  1. 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  At follow-up obtain 2 view radiographs of the right great toe.  Follow-Up Instructions: Return in about 4 weeks (around 05/26/2021).   Ortho Exam  Patient is alert, oriented, no adenopathy, well-dressed, normal affect, normal respiratory effort. Examination patient has a good dorsalis pedis and posterior tibial pulse there is ecchymosis and bruising of the toes there is no angular deformity to the great toe.  He has good granulation tissue over the dorsum of the proximal phalanx where a fracture blister popped.  This has an area 15 mm in diameter with healthy granulation tissue there is no exposed bone or tendon.  Review of the radiographs shows a nondisplaced fracture of the base of the proximal phalanx as well as a nondisplaced fracture base of the tuft of the great toe.  Imaging: No results found. No images are attached to the encounter.  Labs: No results found for: HGBA1C, ESRSEDRATE,  CRP, LABURIC, REPTSTATUS, GRAMSTAIN, CULT, LABORGA   Lab Results  Component Value Date   ALBUMIN 4.2 05/18/2020   ALBUMIN 4.2 07/02/2014    No results found for: MG No results found for: VD25OH  No results found for: PREALBUMIN CBC EXTENDED Latest Ref Rng & Units 05/18/2020 04/11/2019  WBC 4.0 - 10.5 K/uL 7.0 8.2  RBC 4.22 - 5.81 Mil/uL 4.74 4.59  HGB 13.0 - 17.0 g/dL 67.3 41.9  HCT 37.9 - 02.4 % 43.8 41.7  PLT 150.0 - 400.0 K/uL 262.0 262  NEUTROABS 1.4 - 7.7 K/uL 3.5 4,157  LYMPHSABS 0.7 - 4.0 K/uL 2.6 3,173     There is no height or weight on file to calculate BMI.  Orders:  No orders of the defined types were placed in this encounter.  No orders of the defined types were placed in this encounter.    Procedures: No procedures performed  Clinical Data: No additional findings.  ROS:  All other systems negative, except as noted in the HPI. Review of Systems  Objective: Vital Signs: There were no vitals taken for this visit.  Specialty Comments:  No specialty comments available.  PMFS History: There are no problems to display for this patient.  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   Hypercholesterolemia 03/2019   pt declined statin trial 03/2019 and 04/2020    Family History  Problem Relation Age of Onset   Leukemia Mother    Diabetes Mother    Hearing loss Mother    Hearing loss Father    Stroke Father    Diabetes Father    Lung cancer Maternal Grandfather    Diabetes Paternal Grandfather     Past Surgical History:  Procedure Laterality Date   COLONOSCOPY  09/21/2014   adenomas->recall 3-5 yrs   INGUINAL HERNIA REPAIR  1999   bilat   SPIROMETRY  2011   Normal   TONSILLECTOMY AND ADENOIDECTOMY  1965   Social History   Occupational History   Not on file  Tobacco Use   Smoking status: Former    Types: Cigarettes    Quit date: 04/02/2012    Years  since quitting: 9.0   Smokeless tobacco: Never  Vaping Use   Vaping Use: Never used  Substance and Sexual Activity   Alcohol use: Yes    Alcohol/week: 1.0 - 2.0 standard drink    Types: 1 - 2 Cans of beer per week   Drug use: Never   Sexual activity: Not on file

## 2021-04-29 ENCOUNTER — Ambulatory Visit (HOSPITAL_BASED_OUTPATIENT_CLINIC_OR_DEPARTMENT_OTHER): Payer: Medicare Other | Admitting: Orthopaedic Surgery

## 2021-05-12 ENCOUNTER — Other Ambulatory Visit: Payer: Self-pay | Admitting: Family Medicine

## 2021-05-19 ENCOUNTER — Encounter: Payer: Medicare Other | Admitting: Family Medicine

## 2021-05-26 ENCOUNTER — Ambulatory Visit (INDEPENDENT_AMBULATORY_CARE_PROVIDER_SITE_OTHER): Payer: Medicare Other | Admitting: Orthopedic Surgery

## 2021-05-26 ENCOUNTER — Ambulatory Visit: Payer: Self-pay

## 2021-05-26 ENCOUNTER — Other Ambulatory Visit: Payer: Self-pay

## 2021-05-26 ENCOUNTER — Encounter: Payer: Self-pay | Admitting: Orthopedic Surgery

## 2021-05-26 DIAGNOSIS — S92414A Nondisplaced fracture of proximal phalanx of right great toe, initial encounter for closed fracture: Secondary | ICD-10-CM | POA: Diagnosis not present

## 2021-05-26 NOTE — Progress Notes (Signed)
Office Visit Note   Patient: Shannon King           Date of Birth: 24-Jul-1950           MRN: [id number] Visit Date: 05/26/2021              Requested by: Jeoffrey Massed, MD [address] [place],  [place] [address] PCP: Jeoffrey Massed, MD  Chief Complaint  Patient presents with   Right Foot - Fracture, Follow-up    Right great toe DOI 04/20/21      HPI: Patient is a 70 year old gentleman who is about 5 weeks status post fracture of the proximal phalanx right great toe.Did use an antibiotic ointment for a fracture blister currently in a postoperative shoe.  Assessment & Plan: Visit Diagnoses:  1. Nondisplaced fracture of proximal phalanx of right great toe, initial encounter for closed fracture     Plan: Patient will advance to regular shoewear and increase activities as tolerated.  Follow-Up Instructions: Return if symptoms worsen or fail to improve.   Ortho Exam  Patient is alert, oriented, no adenopathy, well-dressed, normal affect, normal respiratory effort. Examination the great toe has no pain with passive or active range of motion there is no crepitation.  There is no swelling no open ulcers there is good epithelization of the skin there is some dependent redness.  Imaging: XR Toe Great Right  Result Date: 05/26/2021 2 view radiographs of the right great toe shows stable healing of the proximal phalanx the MTP joint is congruent  No images are attached to the encounter.  Labs: No results found for: HGBA1C, ESRSEDRATE, CRP, LABURIC, REPTSTATUS, GRAMSTAIN, CULT, LABORGA   Lab Results  Component Value Date   ALBUMIN 4.2 05/18/2020   ALBUMIN 4.2 07/02/2014    No results found for: MG No results found for: VD25OH  No results found for: PREALBUMIN CBC EXTENDED Latest Ref Rng & Units 05/18/2020 04/11/2019  WBC 4.0 - 10.5 K/uL 7.0 8.2  RBC 4.22 - 5.81 Mil/uL 4.74 4.59  HGB 13.0 - 17.0 g/dL 35.7 01.7  HCT 79.3 - 90.3 % 43.8 41.7  PLT 150.0 -  400.0 K/uL 262.0 262  NEUTROABS 1.4 - 7.7 K/uL 3.5 4,157  LYMPHSABS 0.7 - 4.0 K/uL 2.6 3,173     There is no height or weight on file to calculate BMI.  Orders:  Orders Placed This Encounter  Procedures   XR Toe Great Right   No orders of the defined types were placed in this encounter.    Procedures: No procedures performed  Clinical Data: No additional findings.  ROS:  All other systems negative, except as noted in the HPI. Review of Systems  Objective: Vital Signs: There were no vitals taken for this visit.  Specialty Comments:  No specialty comments available.  PMFS History: There are no problems to display for this patient.  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   Hypercholesterolemia 03/2019   pt declined statin trial 03/2019 and 04/2020    Family History  Problem Relation Age of Onset   Leukemia Mother    Diabetes Mother    Hearing loss Mother    Hearing loss Father    Stroke Father    Diabetes Father    Lung cancer Maternal Grandfather    Diabetes Paternal Grandfather     Past Surgical History:  Procedure Laterality Date  COLONOSCOPY  09/21/2014   adenomas->recall 3-5 yrs   INGUINAL HERNIA REPAIR  1999   bilat   SPIROMETRY  2011   Normal   TONSILLECTOMY AND ADENOIDECTOMY  1965   Social History   Occupational History   Not on file  Tobacco Use   Smoking status: Former    Types: Cigarettes    Quit date: 04/02/2012    Years since quitting: 9.1   Smokeless tobacco: Never  Vaping Use   Vaping Use: Never used  Substance and Sexual Activity   Alcohol use: Yes    Alcohol/week: 1.0 - 2.0 standard drink    Types: 1 - 2 Cans of beer per week   Drug use: Never   Sexual activity: Not on file

## 2021-06-02 ENCOUNTER — Ambulatory Visit (INDEPENDENT_AMBULATORY_CARE_PROVIDER_SITE_OTHER): Payer: Medicare Other | Admitting: Family Medicine

## 2021-06-02 ENCOUNTER — Other Ambulatory Visit: Payer: Self-pay

## 2021-06-02 ENCOUNTER — Encounter: Payer: Self-pay | Admitting: Family Medicine

## 2021-06-02 VITALS — BP 114/71 | HR 80 | Temp 98.0°F | Ht 68.5 in | Wt 184.4 lb

## 2021-06-02 DIAGNOSIS — F411 Generalized anxiety disorder: Secondary | ICD-10-CM | POA: Diagnosis not present

## 2021-06-02 DIAGNOSIS — Z125 Encounter for screening for malignant neoplasm of prostate: Secondary | ICD-10-CM | POA: Diagnosis not present

## 2021-06-02 DIAGNOSIS — Z0000 Encounter for general adult medical examination without abnormal findings: Secondary | ICD-10-CM

## 2021-06-02 DIAGNOSIS — Z23 Encounter for immunization: Secondary | ICD-10-CM

## 2021-06-02 DIAGNOSIS — E7800 Pure hypercholesterolemia, unspecified: Secondary | ICD-10-CM

## 2021-06-02 LAB — COMPREHENSIVE METABOLIC PANEL
ALT: 15 U/L (ref 0–53)
AST: 14 U/L (ref 0–37)
Albumin: 3.9 g/dL (ref 3.5–5.2)
Alkaline Phosphatase: 37 U/L — ABNORMAL LOW (ref 39–117)
BUN: 13 mg/dL (ref 6–23)
CO2: 27 mEq/L (ref 19–32)
Calcium: 8.8 mg/dL (ref 8.4–10.5)
Chloride: 103 mEq/L (ref 96–112)
Creatinine, Ser: 0.78 mg/dL (ref 0.40–1.50)
GFR: 91.79 mL/min (ref >60.00)
Glucose, Bld: 96 mg/dL (ref 70–99)
Potassium: 4.1 mEq/L (ref 3.5–5.1)
Sodium: 137 mEq/L (ref 135–145)
Total Bilirubin: 0.7 mg/dL (ref 0.2–1.2)
Total Protein: 6.5 g/dL (ref 6.0–8.3)

## 2021-06-02 LAB — CBC WITH DIFFERENTIAL/PLATELET
Basophils Absolute: 0 10*3/uL (ref 0.0–0.1)
Basophils Relative: 0.5 % (ref 0.0–3.0)
Eosinophils Absolute: 0.2 10*3/uL (ref 0.0–0.7)
Eosinophils Relative: 2.1 % (ref 0.0–5.0)
HCT: 42.4 % (ref 39.0–52.0)
Hemoglobin: 14.1 g/dL (ref 13.0–17.0)
Lymphocytes Relative: 35.5 % (ref 12.0–46.0)
Lymphs Abs: 2.6 10*3/uL (ref 0.7–4.0)
MCHC: 33.2 g/dL (ref 30.0–36.0)
MCV: 92.2 fl (ref 78.0–100.0)
Monocytes Absolute: 0.6 10*3/uL (ref 0.1–1.0)
Monocytes Relative: 8.3 % (ref 3.0–12.0)
Neutro Abs: 3.9 10*3/uL (ref 1.4–7.7)
Neutrophils Relative %: 53.6 % (ref 43.0–77.0)
Platelets: 256 10*3/uL (ref 150.0–400.0)
RBC: 4.6 Mil/uL (ref 4.22–5.81)
RDW: 12.9 % (ref 11.5–15.5)
WBC: 7.2 10*3/uL (ref 4.0–10.5)

## 2021-06-02 LAB — LIPID PANEL
Cholesterol: 190 mg/dL (ref 0–200)
HDL: 41.8 mg/dL (ref >39.00)
LDL Cholesterol: 134 mg/dL — ABNORMAL HIGH (ref 0–99)
NonHDL: 148.66
Total CHOL/HDL Ratio: 5
Triglycerides: 72 mg/dL (ref 0.0–149.0)
VLDL: 14.4 mg/dL (ref 0.0–40.0)

## 2021-06-02 LAB — PSA, MEDICARE: PSA: 0.81 ng/ml (ref 0.10–4.00)

## 2021-06-02 MED ORDER — FLUOXETINE HCL 20 MG PO CAPS: 20.0000 mg | ORAL_CAPSULE | Freq: Every day | ORAL | 3 refills | Status: DC

## 2021-06-02 NOTE — Progress Notes (Signed)
Office Note 06/02/2021  CC:  Chief Complaint  Patient presents with   Annual Exam    Pt is fasting    HPI:  Patient is a 71 y.o. male who is here for annual health maintenance exam. I last saw him 1 yr ago for CPE. A/P as of that visit: "1) Anger control difficulties, irritability, anxiety->all better and well controlled on fluoxetine 10mg  qd long term. Continue this->RFs eRx'd today.   2) Health maintenance exam: Reviewed age and gender appropriate health maintenance issues (prudent diet, regular exercise, health risks of tobacco and excessive alcohol, use of seatbelts, fire alarms in home, use of sunscreen).  Also reviewed age and gender appropriate health screening as well as vaccine recommendations. Vaccines: Flu vaccine->given today.  Covid->all UTD.  Pt declined pneumonia vaccines and Tdap in the past-->pt declined again today. Labs: CBC, CMET, FLP  (HLD). Prostate ca screening: pt declined in 2020->he will get PSA today. Colon ca screening: adenomas 2016, due for recall->pt declines any further scopes."  INTERIM HX: Doing well, no acute complaints. Has been on fluoxetine 10 mg a day long-term for anxiety and feels like he is still significantly irritable and keyed up at times.  Sometimes this affects his sleep and appetite.  Denies depressed mood.  No panic attacks.   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   Hypercholesterolemia 03/2019   pt declined statin trial 03/2019 and 04/2020    Past Surgical History:  Procedure Laterality Date   COLONOSCOPY  09/21/2014   adenomas->recall 3-5 yrs   INGUINAL HERNIA REPAIR  1999   bilat   SPIROMETRY  2011   Normal   TONSILLECTOMY AND ADENOIDECTOMY  1965    Family History  Problem Relation Age of Onset   Leukemia Mother    Diabetes Mother    Hearing loss Mother    Hearing loss Father    Stroke Father    Diabetes Father    Lung  cancer Maternal Grandfather    Diabetes Paternal Grandfather     Social History   Socioeconomic History   Marital status: Married    Spouse name: Not on file   Number of children: Not on file   Years of education: Not on file   Highest education level: Not on file  Occupational History   Not on file  Tobacco Use   Smoking status: Former    Types: Cigarettes    Quit date: 04/02/2012    Years since quitting: 9.1   Smokeless tobacco: Never  Vaping Use   Vaping Use: Never used  Substance and Sexual Activity   Alcohol use: Yes    Alcohol/week: 1.0 - 2.0 standard drink    Types: 1 - 2 Cans of beer per week   Drug use: Never   Sexual activity: Not on file  Other Topics Concern   Not on file  Social History Narrative   Married, no children.   Relocated to [place] from [place] 2019.     Educ: 2 yrs college   Occup: "Team [employer]  Lived in [place] area in 1990s->helped build [employer] [address].   Former smoker: 20 pack-yr hx, quit 2013.   Alcohol: 2 beers per day, rare liquor.      Social Determinants of Health   Financial Resource Strain: Not on file  Food Insecurity: Not on file  Transportation Needs: Not on file  Physical Activity: Not on file  Stress: Not on file  Social Connections: Not on file  Intimate Partner Violence: Not on file    Outpatient Medications Prior to Visit  Medication Sig Dispense Refill   Omega-3 Fatty Acids (FISH OIL) 1000 MG CAPS Take by mouth daily.     Saw Palmetto 450 MG CAPS Take by mouth 2 (two) times daily.     VITAMIN D PO Take 125 mcg by mouth daily.     FLUoxetine (PROZAC) 10 MG capsule TAKE 1 CAPSULE BY MOUTH EVERY DAY 90 capsule 0   Multiple Vitamins-Minerals (EMERGEN-C IMMUNE PO) Take 1,000 mg by mouth every other day. (Patient not taking: Reported on 04/25/2021)     No facility-administered medications prior to visit.    No Known Allergies  ROS Review of Systems  Constitutional:  Negative for  appetite change, chills, fatigue and fever.  HENT:  Negative for congestion, dental problem, ear pain and sore throat.   Eyes:  Negative for discharge, redness and visual disturbance.  Respiratory:  Negative for cough, chest tightness, shortness of breath and wheezing.   Cardiovascular:  Negative for chest pain, palpitations and leg swelling.  Gastrointestinal:  Negative for abdominal pain, blood in stool, diarrhea, nausea and vomiting.  Genitourinary:  Negative for difficulty urinating, dysuria, flank pain, frequency, hematuria and urgency.  Musculoskeletal:  Negative for arthralgias, back pain, joint swelling, myalgias and neck stiffness.  Skin:  Negative for pallor and rash.  Neurological:  Negative for dizziness, speech difficulty, weakness and headaches.  Hematological:  Negative for adenopathy. Does not bruise/bleed easily.  Psychiatric/Behavioral:  Negative for confusion and sleep disturbance. The patient is not nervous/anxious.    PE; Vitals with BMI 06/02/2021 04/25/2021 04/24/2021  Height 5' 8.504" 5\' 10"  5\' 10"   Weight 184 lbs 6 oz 184 lbs 6 oz 175 lbs  BMI 27.63 [id number]  Systolic [id number]  Diastolic 71 - 95  Pulse 80 - 94    Gen: Alert, well appearing.  Patient is oriented to person, place, time, and situation. AFFECT: pleasant, lucid thought and speech. ENT: Ears: EACs clear, normal epithelium.  TMs with good light reflex and landmarks bilaterally.  Eyes: no injection, icteris, swelling, or exudate.  EOMI, PERRLA. Nose: no drainage or turbinate edema/swelling.  No injection or focal lesion.  Mouth: lips without lesion/swelling.  Oral mucosa pink and moist.  Dentition intact and without obvious caries or gingival swelling.  Oropharynx without erythema, exudate, or swelling.  Neck: supple/nontender.  No LAD, mass, or TM.  Carotid pulses 2+ bilaterally, without bruits. CV: RRR, no m/r/g.   LUNGS: CTA bilat, nonlabored resps, good aeration in all lung fields. ABD: soft, NT,  ND, BS normal.  No hepatospenomegaly or mass.  No bruits. EXT: no clubbing, cyanosis, or edema.  Musculoskeletal: no joint swelling, erythema, warmth, or tenderness.  ROM of all joints intact. Skin - no sores or suspicious lesions or rashes or color changes  Pertinent labs:  Lab Results  Component Value Date   TSH 1.14 04/11/2019   Lab Results  Component Value Date   WBC 7.0 05/18/2020   HGB 14.8 05/18/2020   HCT 43.8 05/18/2020   MCV 92.4 05/18/2020   PLT 262.0 05/18/2020   Lab Results  Component Value Date   CREATININE 0.73 05/18/2020   BUN 10 05/18/2020   NA 136 05/18/2020   K 4.3 05/18/2020   CL 102 05/18/2020   CO2 29 05/18/2020   Lab Results  Component Value Date  ALT 16 05/18/2020   AST 15 05/18/2020   ALKPHOS 42 05/18/2020   BILITOT 0.7 05/18/2020   Lab Results  Component Value Date   CHOL 185 05/18/2020   Lab Results  Component Value Date   HDL 46.90 05/18/2020   Lab Results  Component Value Date   LDLCALC 121 (H) 05/18/2020   Lab Results  Component Value Date   TRIG 85.0 05/18/2020   Lab Results  Component Value Date   CHOLHDL 4 05/18/2020   Lab Results  Component Value Date   PSA 0.77 05/18/2020   ASSESSMENT AND PLAN:   1) GAD; suboptimal improvement on fluoxetine 10 mg a day. Increase to 20 mg a day.  2) Health maintenance exam: Reviewed age and gender appropriate health maintenance issues (prudent diet, regular exercise, health risks of tobacco and excessive alcohol, use of seatbelts, fire alarms in home, use of sunscreen).  Also reviewed age and gender appropriate health screening as well as vaccine recommendations. Vaccines: Prevnar20->given today.  Shingrix->he'll schedule return nurse visit for this.  Flu->given today. Labs: cbc, cmet, lipids, psa. Prostate ca screening: PSA today. Colon ca screening: adenomas 2016, due for recall->pt declines any further scopes or other colon ca screening.  An After Visit Summary was printed and  given to the patient.  FOLLOW UP:  Return in about 4 weeks (around 06/30/2021) for f/u anxiety/med.  Signed:  Crissie Sickles, MD           06/02/2021

## 2021-06-02 NOTE — Patient Instructions (Signed)

## 2021-06-03 ENCOUNTER — Encounter: Payer: Self-pay | Admitting: Family Medicine

## 2021-06-29 ENCOUNTER — Other Ambulatory Visit: Payer: Self-pay

## 2021-06-30 ENCOUNTER — Encounter: Payer: Self-pay | Admitting: Family Medicine

## 2021-06-30 ENCOUNTER — Ambulatory Visit (INDEPENDENT_AMBULATORY_CARE_PROVIDER_SITE_OTHER): Payer: Medicare Other | Admitting: Family Medicine

## 2021-06-30 VITALS — BP 96/59 | HR 73 | Temp 97.9°F | Ht 68.5 in | Wt 183.6 lb

## 2021-06-30 DIAGNOSIS — E7800 Pure hypercholesterolemia, unspecified: Secondary | ICD-10-CM

## 2021-06-30 DIAGNOSIS — F411 Generalized anxiety disorder: Secondary | ICD-10-CM

## 2021-06-30 DIAGNOSIS — R454 Irritability and anger: Secondary | ICD-10-CM

## 2021-06-30 MED ORDER — ZOSTER VAC RECOMB ADJUVANTED 50 MCG/0.5ML IM SUSR: 0.5000 mL | Freq: Once | INTRAMUSCULAR | 1 refills | Status: AC

## 2021-06-30 NOTE — Progress Notes (Signed)
OFFICE VISIT  06/30/2021  CC:  Chief Complaint  Patient presents with   Follow-up    anxiety    HPI:    Patient is a 71 y.o. male who presents for 1 mo f/u anxiety and difficulty controlling anger. A/P as of last visit: "1) GAD; suboptimal improvement on fluoxetine 10 mg a day. Increase to 20 mg a day.   2) Health maintenance exam: Reviewed age and gender appropriate health maintenance issues (prudent diet, regular exercise, health risks of tobacco and excessive alcohol, use of seatbelts, fire alarms in home, use of sunscreen).  Also reviewed age and gender appropriate health screening as well as vaccine recommendations. Vaccines: Prevnar20->given today.  Shingrix->he'll schedule return nurse visit for this.  Flu->given today. Labs: cbc, cmet, lipids, psa. Prostate ca screening: PSA today. Colon ca screening: adenomas 2016, due for recall->pt declines any further scopes or other colon ca screening."  INTERIM HX: Patient is doing significantly better.  Less anger, less irritability, less anxiety.  He is more content with how things are going in his life.  No side effects from the fluoxetine.  We discussed his cholesterol levels that were done last visit.  He has started an over-the-counter fiber supplement that he prefers to take for his cholesterol rather than any prescription cholesterol medication.  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  90 capsule 3   Omega-3 Fatty Acids (FISH OIL) 1000 MG CAPS Take by mouth daily.     OVER THE COUNTER MEDICATION daily. EMERGEN-C IMMUNE     psyllium (METAMUCIL) 58.6 % powder Take 1 packet by mouth daily.     Saw Palmetto 450 MG CAPS Take by mouth 2 (two) times daily.     VITAMIN D PO Take 125 mcg by mouth daily.     Fiber POWD Take by mouth daily.     Multiple Vitamins-Minerals (EMERGEN-C IMMUNE PO) Take 1,000 mg by mouth every other day. (Patient not taking: Reported on 04/25/2021)     No facility-administered medications prior to visit.    No Known Allergies  ROS As per HPI  PE: Vitals with BMI 06/30/2021 06/02/2021 04/25/2021  Height 5' 8.5" 5' 8.504" 5\' 10"   Weight 183 lbs 10 oz 184 lbs 6 oz 184 lbs 6 oz  BMI 27.51 27.63 26.46  Systolic 96 114 -  Diastolic 59 71 -  Pulse 73 80 -   Physical Exam  Gen: Alert, well appearing.  Patient is oriented to person, place, time, and situation. AFFECT: pleasant, lucid thought and speech. No further exam today.  LABS:  Last CBC Lab Results  Component Value Date   WBC 7.2 06/02/2021   HGB 14.1 06/02/2021   HCT 42.4 06/02/2021   MCV 92.2 06/02/2021   MCH 30.5 04/11/2019   RDW 12.9 06/02/2021   PLT 256.0 06/02/2021   Last metabolic panel Lab Results  Component Value Date   GLUCOSE  96 06/02/2021   NA 137 06/02/2021   K 4.1 06/02/2021   CL 103 06/02/2021   CO2 27 06/02/2021   BUN 13 06/02/2021   CREATININE 0.78 06/02/2021   CALCIUM 8.8 06/02/2021   PROT 6.5 06/02/2021   ALBUMIN 3.9 06/02/2021   BILITOT 0.7 06/02/2021   ALKPHOS 37 (L) 06/02/2021   AST 14 06/02/2021   ALT 15 06/02/2021   Last lipids Lab Results  Component Value Date   CHOL 190 06/02/2021   HDL 41.80 06/02/2021   LDLCALC 134 (H) 06/02/2021   TRIG 72.0 06/02/2021   CHOLHDL 5 06/02/2021      IMPRESSION AND PLAN:  #1 generalized anxiety, difficulty controlling anger. Much improved on fluoxetine 20 mg a day. Refilled today.  We discussed follow-up  in he feels comfortable with letting me know in the future if things start to take a turn for the worse and I trust him on this.  We will recheck at the time of his next physical in 11 to 12 months  2.  Hypercholesterolemia.  10-year Framingham cardiovascular risk equals 13%. His LDL was 134 and HDL 42 about 1 month ago. Goal LDL less than 100.  He declined statins.  He prefers an over-the-counter fiber supplement daily and has already made great changes in diet over the last month.  He will need to increase exercise.  An After Visit Summary was printed and given to the patient.  FOLLOW UP: Return for 11-12 mo f/u cpe.  Signed:  Santiago Bumpers, MD           06/30/2021

## 2021-06-30 NOTE — Addendum Note (Signed)
Addended by: Emi Holes D on: 06/30/2021 10:23 AM   Modules accepted: Orders

## 2021-08-03 ENCOUNTER — Telehealth: Payer: Self-pay

## 2021-08-03 NOTE — Telephone Encounter (Signed)
Called pt to schedule AWV. Please schedule with health coach, Toria or Leniyah Martell. ? ?

## 2021-08-03 NOTE — Telephone Encounter (Signed)
Pt refused AWV, he said he doesn't need an AWV. ?

## 2021-08-03 NOTE — Telephone Encounter (Signed)
Noted  

## 2021-09-19 DIAGNOSIS — H43813 Vitreous degeneration, bilateral: Secondary | ICD-10-CM | POA: Diagnosis not present

## 2021-09-19 DIAGNOSIS — H31091 Other chorioretinal scars, right eye: Secondary | ICD-10-CM | POA: Diagnosis not present

## 2021-09-19 DIAGNOSIS — H2513 Age-related nuclear cataract, bilateral: Secondary | ICD-10-CM | POA: Diagnosis not present

## 2021-09-19 DIAGNOSIS — H16223 Keratoconjunctivitis sicca, not specified as Sjogren's, bilateral: Secondary | ICD-10-CM | POA: Diagnosis not present

## 2021-09-27 ENCOUNTER — Telehealth: Payer: Self-pay

## 2021-09-27 DIAGNOSIS — F4323 Adjustment disorder with mixed anxiety and depressed mood: Secondary | ICD-10-CM

## 2021-09-27 DIAGNOSIS — F411 Generalized anxiety disorder: Secondary | ICD-10-CM

## 2021-09-27 DIAGNOSIS — R454 Irritability and anger: Secondary | ICD-10-CM

## 2021-09-27 NOTE — Telephone Encounter (Signed)
Last seen 2/2 and mentioned he would let provider know any of updates. ? ? ?Please review and advise ? ?

## 2021-09-27 NOTE — Telephone Encounter (Signed)
Patient is requesting for a referral to counselor, behavorial health, psychiatrist, or anyone who can talk with him about everything going on his life right now.  Mentioned marriage issues, whats going on the world currently, and some other things going on in his life.  He said he does not have any suicidal thoughts. ?He is aware Dr. Milinda Cave has already left office today, and will return tomorrow.  ? ?Reason for Referral Request:  see above ? ?Has patient been seen PCP for this complaint?  He said yes at his last visit. ? ?No,  please schedule patient for appointment for complaint. ? ?Patient scheduled on:  n/a ? ?Yes, please find out following information. ? ?Referral for which specialty:  Behavorial Health ? ?Preferred office/provider:  accepts [facility] ? ?  ?

## 2021-09-27 NOTE — Telephone Encounter (Signed)
Behavioral health counselor eferral ordered. ?Let him know that he can f/u with me as well if he can't wait to see psychologist/counselor. ?

## 2021-09-28 NOTE — Telephone Encounter (Signed)
LM for pt to return call regarding referral  

## 2021-09-29 NOTE — Telephone Encounter (Signed)
Patient calling Shannon King back about referral.  I told him the following the following information: ? ?Copied and pasted from Dr. Milinda Cave ?"Behavioral health counselor eferral ordered. ?Let him know that he can f/u with me as well if he can't wait to see psychologist/counselor." ? ?Patient was very Adult nurse.  I gave him phone number to [facility].  Patient will call us back and schedule appt with Dr. Milinda Cave IF he is unable to get appt soon with [facility]. ? ?

## 2021-09-29 NOTE — Telephone Encounter (Signed)
Noted  

## 2021-10-12 ENCOUNTER — Telehealth: Payer: Self-pay

## 2021-10-12 ENCOUNTER — Encounter: Payer: Self-pay | Admitting: Family Medicine

## 2021-10-12 ENCOUNTER — Ambulatory Visit (INDEPENDENT_AMBULATORY_CARE_PROVIDER_SITE_OTHER): Payer: Medicare Other | Admitting: Family Medicine

## 2021-10-12 VITALS — BP 125/73 | HR 67 | Temp 98.1°F | Ht 68.5 in | Wt 178.2 lb

## 2021-10-12 DIAGNOSIS — R454 Irritability and anger: Secondary | ICD-10-CM

## 2021-10-12 DIAGNOSIS — F411 Generalized anxiety disorder: Secondary | ICD-10-CM

## 2021-10-12 MED ORDER — FLUOXETINE HCL 40 MG PO CAPS: 40.0000 mg | ORAL_CAPSULE | Freq: Every day | ORAL | 1 refills | Status: DC

## 2021-10-12 NOTE — Telephone Encounter (Signed)
If he really wants to wean off of fluoxetine then that is okay with me.  It is okay to take the fluoxetine 20 mg tabs every other day for 5 doses and then he can stop it. ? ?

## 2021-10-12 NOTE — Telephone Encounter (Signed)
LM for pt to return call to discuss.  

## 2021-10-12 NOTE — Progress Notes (Signed)
OFFICE VISIT ? ?10/12/2021 ? ?CC:  ?Chief Complaint  ?Patient presents with  ? Anxiety  ? ?Patient is a 71 y.o. male who presents for ongoing anxiety and anger control problems ? ?HPI: ?This has been a lifetime problem for him.  He keeps in his many frustrations, then his anger comes out as lash outs verbally to his wife usually but sometimes to coworkers.  Denies depressed mood.  He does say "everything" aggravates him.  Cites things going on with our country, some problems with his sister, patient says his mind will not shut down at night when he tries to sleep because of all these frustrations running through his head.  No panic attacks. ?Feels fine physically. ?He cannot tell if the fluoxetine 20 mg/day is helping or not, although he feels like it did when he initially got on it about 18 months ago. ?Says his relationship with his wife is strained frequently by his behavior and she recently laid down the line and said that he needs to get some help. ?Denies any abuse of alcohol or drugs. ? ? ?Past Medical History:  ?Diagnosis Date  ? BPH with obstruction/lower urinary tract symptoms   ? 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 Hypercholesterolemia 03/2019  ? pt declined statin trial 03/2019, 04/2020, 05/2021  ? ? ?Past Surgical History:  ?Procedure Laterality Date  ? COLONOSCOPY  09/21/2014  ? adenomas->recall 3-5 yrs  ? INGUINAL HERNIA REPAIR  1999  ? bilat  ? SPIROMETRY  2011  ? Normal  ? TONSILLECTOMY AND ADENOIDECTOMY  1965  ? ?Social History  ? ?Socioeconomic History  ? Marital status: Married  ?  Spouse name: Not on file  ? Number of children: Not on file  ? Years of education: Not on file  ? Highest education level: Not on file  ?Occupational History  ? Not on file  ?Tobacco Use  ? Smoking status: Former  ?  Types: Cigarettes  ?  Quit date: 04/02/2012  ?  Years since quitting: 9.5  ? Smokeless tobacco: Never  ?Vaping Use  ? Vaping Use: Never used  ?Substance and Sexual Activity  ? Alcohol use: Yes  ?   Alcohol/week: 1.0 - 2.0 standard drink  ?  Types: 1 - 2 Cans of beer per week  ? Drug use: Never  ? Sexual activity: Not on file  ?Other Topics Concern  ? Not on file  ?Social History Narrative  ? Married, no children.  ? Relocated to [place] from [place] 2019.    ? Educ: 2 yrs college  ? 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  Lived in [place] area in 1990s->helped build [employer] [address].  ? Former smoker: 20 pack-yr hx, quit 2013.  ? Alcohol: 2 beers per day, rare liquor.  ?   ? ?Social Determinants of Health  ? ?Financial Resource Strain: Not on file  ?Food Insecurity: Not on file  ?Transportation Needs: Not on file  ?Physical Activity: Not on file  ?Stress: Not on file  ?Social Connections: Not on file  ? ? ?Outpatient Medications Prior to Visit  ?Medication Sig Dispense Refill  ? Omega-3 Fatty Acids (FISH OIL) 1000 MG CAPS Take by mouth daily.    ? OVER THE COUNTER MEDICATION daily. EMERGEN-C IMMUNE    ? psyllium (METAMUCIL) 58.6 % powder Take 1 packet by mouth daily.    ? Saw Palmetto 450 MG CAPS Take by mouth 2 (two) times daily.    ? VITAMIN D PO  Take 125 mcg by mouth daily.    ? FLUoxetine (PROZAC) 20 MG capsule Take 1 capsule (20 mg total) by mouth daily. 90 capsule 3  ? ?No facility-administered medications prior to visit.  ? ? ?No Known Allergies ? ?ROS ?As per HPI ? ?PE: ? ?  10/12/2021  ?  2:06 PM 06/30/2021  ?  9:43 AM 06/02/2021  ?  8:55 AM  ?Vitals with BMI  ?Height 5' 8.5" 5' 8.5" 5' 8.504"  ?Weight 178 lbs 3 oz 183 lbs 10 oz 184 lbs 6 oz  ?BMI 26.7 27.51 27.63  ?Systolic 125 96 114  ?Diastolic 73 59 71  ?Pulse 67 73 80  ? ? ? ?Physical Exam ? ?Gen: Alert, well appearing.  Patient is oriented to person, place, time, and situation. ?AFFECT: pleasant, lucid thought and speech. ?No further exam today. ? ?LABS:  ?Last CBC ?Lab Results  ?Component Value Date  ? WBC 7.2 06/02/2021  ? HGB 14.1 06/02/2021  ? HCT 42.4 06/02/2021  ? MCV 92.2 06/02/2021  ? MCH 30.5 04/11/2019  ? RDW 12.9 06/02/2021  ?  PLT 256.0 06/02/2021  ? ?Last metabolic panel ?Lab Results  ?Component Value Date  ? GLUCOSE 96 06/02/2021  ? NA 137 06/02/2021  ? K 4.1 06/02/2021  ? CL 103 06/02/2021  ? CO2 27 06/02/2021  ? BUN 13 06/02/2021  ? CREATININE 0.78 06/02/2021  ? CALCIUM 8.8 06/02/2021  ? PROT 6.5 06/02/2021  ? ALBUMIN 3.9 06/02/2021  ? BILITOT 0.7 06/02/2021  ? ALKPHOS 37 (L) 06/02/2021  ? AST 14 06/02/2021  ? ALT 15 06/02/2021  ? ?IMPRESSION AND PLAN: ? ?Chronic anxiety, difficulty controlling anger. ?Definitely would benefit from establishing a counseling relationship with a Research scientist (physical sciences) or psychologist. ?We discussed medication today as well: Options are to either discontinue the fluoxetine 20 mg, keep it at 20 mg a day, or try pushing the dose to 40 mg today. ?We decided to go ahead and try the 40 mg daily fluoxetine dose. ? ?An After Visit Summary was printed and given to the patient. ? ?FOLLOW UP: Return for 3-4 wks f/u anger. ? ?Signed:  Santiago Bumpers, MD           10/12/2021 ? ? ?

## 2021-10-12 NOTE — Telephone Encounter (Signed)
Patient was just seen earlier today by Dr. Milinda Cave.  Pt stated that on label of prescription bottle states he could not drink while taking meds. ?Patient would like to discontinue use of medications.  How does he wean himself off? ? ? ?

## 2021-10-12 NOTE — Telephone Encounter (Signed)
Pt came into office and asked how he could ween off prozac. He states that he read the side effects of it and is now realizing that it could be the cause of some of the changes he is having. Pt asked pharmacy how to make this change and was advised to have dr. give him 10mg  of prozac for a few weeks and the he should be able to stop the medication after. I advised pt that we should be able to give him a call tomorrow to advise him further on any changes from PCP and that he can hold med until advised of what to do. ?

## 2021-10-13 NOTE — Telephone Encounter (Signed)
Pt advised of recommendations.  

## 2021-11-03 ENCOUNTER — Other Ambulatory Visit: Payer: Self-pay | Admitting: Family Medicine

## 2021-11-09 ENCOUNTER — Ambulatory Visit (INDEPENDENT_AMBULATORY_CARE_PROVIDER_SITE_OTHER): Payer: Medicare Other | Admitting: Family Medicine

## 2021-11-09 ENCOUNTER — Telehealth: Payer: Self-pay

## 2021-11-09 ENCOUNTER — Encounter: Payer: Self-pay | Admitting: Family Medicine

## 2021-11-09 VITALS — BP 120/76 | HR 71 | Temp 98.1°F | Ht 68.5 in | Wt 177.6 lb

## 2021-11-09 DIAGNOSIS — R454 Irritability and anger: Secondary | ICD-10-CM

## 2021-11-09 NOTE — Telephone Encounter (Signed)
Spoke with pt to schedule AWV in office. Patient declined to schedule wellness visit at this time.   

## 2021-11-09 NOTE — Progress Notes (Signed)
OFFICE VISIT  11/09/2021  CC:  Chief Complaint  Patient presents with   Follow-up    anger   Patient is a 71 y.o. male who presents for 3 week f/u difficulty controlling anger. A/P as of last visit: "Chronic anxiety, difficulty controlling anger. Definitely would benefit from establishing a counseling relationship with a Research scientist (physical sciences) or psychologist. We discussed medication today as well: Options are to either discontinue the fluoxetine 20 mg, keep it at 20 mg a day, or try pushing the dose to 40 mg today. We decided to go ahead and try the 40 mg daily fluoxetine dose."  INTERIM HX: Shannon King tells me he is feeling a lot better.  Right after last visit he decided to stop the fluoxetine instead of increase the dose. He weaned off over the course of 5 days and it did not take long to notice that he felt less irritable, slept better, processed his frustration and anger more appropriately, etc.  He says his relationship with his wife is much much improved, as are the relationship with his coworkers. Denies depression.   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     OVER THE COUNTER MEDICATION daily. EMERGEN-C IMMUNE     psyllium (METAMUCIL) 58.6 % powder Take 1 packet by mouth daily.     Saw Palmetto 450 MG CAPS Take by mouth 2 (two) times daily.     VITAMIN D PO Take 125 mcg by mouth daily.     FLUoxetine (PROZAC) 40 MG capsule TAKE 1 CAPSULE (40  MG TOTAL) BY MOUTH DAILY. (Patient not taking: Reported on 11/09/2021) 90 capsule 1   No facility-administered medications prior to visit.    No Known Allergies  ROS As per HPI  PE:    11/09/2021    8:25 AM 10/12/2021    2:06 PM 06/30/2021    9:43 AM  Vitals with BMI  Height 5' 8.5" 5' 8.5" 5' 8.5"  Weight 177 lbs 10 oz 178 lbs 3 oz 183 lbs 10 oz  BMI 26.61 26.7 27.51  Systolic 120 125 96  Diastolic 76 73 59  Pulse 71 67 73     Physical Exam  General: Alert and well-appearing. Affect is pleasant, thought and speech are lucid. No further exam today  LABS:  Last CBC Lab Results  Component Value Date   WBC 7.2 06/02/2021   HGB 14.1 06/02/2021   HCT 42.4 06/02/2021   MCV 92.2 06/02/2021   MCH 30.5 04/11/2019   RDW 12.9 06/02/2021   PLT 256.0 06/02/2021   Last metabolic panel Lab Results  Component Value Date   GLUCOSE 96 06/02/2021   NA 137 06/02/2021   K 4.1 06/02/2021   CL 103 06/02/2021   CO2 27 06/02/2021   BUN 13 06/02/2021   CREATININE 0.78 06/02/2021   CALCIUM 8.8 06/02/2021   PROT 6.5 06/02/2021   ALBUMIN 3.9 06/02/2021  BILITOT 0.7 06/02/2021   ALKPHOS 37 (L) 06/02/2021   AST 14 06/02/2021   ALT 15 06/02/2021   Last lipids Lab Results  Component Value Date   CHOL 190 06/02/2021   HDL 41.80 06/02/2021   LDLCALC 134 (H) 06/02/2021   TRIG 72.0 06/02/2021   CHOLHDL 5 06/02/2021   IMPRESSION AND PLAN:  #1 difficulty controlling anger, chronic worry. He is doing much improved off fluoxetine. It seems this medication was at least in part contributing to the gradual worsening of his psychological symptoms. No new medications today. He never got contacted about counseling referral so I will check into this today.  An After Visit Summary was printed and given to the patient.  FOLLOW UP: Return for As needed.  Signed:  Santiago Bumpers, MD           11/09/2021

## 2021-12-14 ENCOUNTER — Ambulatory Visit: Payer: Medicare Other | Admitting: Family Medicine

## 2022-01-18 ENCOUNTER — Ambulatory Visit (INDEPENDENT_AMBULATORY_CARE_PROVIDER_SITE_OTHER): Payer: Medicare Other | Admitting: Psychologist

## 2022-01-18 DIAGNOSIS — F411 Generalized anxiety disorder: Secondary | ICD-10-CM

## 2022-01-18 NOTE — Progress Notes (Signed)
                Denean Pavon, PsyD 

## 2022-01-18 NOTE — Progress Notes (Signed)
[facility] Counselor Initial Adult Exam  Name: Shannon King Date: 01/18/2022 MRN: [id number] DOB: [birth year] PCP: Jeoffrey Massed, MD  Time spent: 8:07 am to 8:45 am; total time: 38 minutes  This session was held via in person. The patient consented to in-person therapy and was in the clinician's office. Limits of confidentiality were discussed with the patient.   Guardian/Payee:  NA    Paperwork requested: No   Reason for Visit /Presenting Problem: Anxiety and impulse control challenges  Mental Status Exam: Appearance:   Well Groomed     Behavior:  Appropriate  Motor:  Normal  Speech/Language:   Clear and Coherent  Affect:  Appropriate  Mood:  normal  Thought process:  normal  Thought content:    WNL  Sensory/Perceptual disturbances:    WNL  Orientation:  oriented to person, place, and time/date  Attention:  Good  Concentration:  Good  Memory:  WNL  Fund of knowledge:   Good  Insight:    Poor  Judgment:   Fair  Impulse Control:  Poor     Reported Symptoms:  The patient endorsed experiencing irritability, anger, racing thoughts, feeling overwhelmed, difficulty controlling worries, feeling restless, and feeling on edge. He denied suicidal and homicidal ideation.   Risk Assessment: Danger to Self:  No Self-injurious Behavior: No Danger to Others: No Duty to Warn:no Physical Aggression / Violence:No  Access to Firearms a concern: No  Gang Involvement:No  Patient / guardian was educated about steps to take if suicide or homicide risk level increases between visits: n/a While future psychiatric events cannot be accurately predicted, the patient does not currently require acute inpatient psychiatric care and does not currently meet [facility] involuntary commitment criteria.  Substance Abuse History: Current substance abuse: No     Past Psychiatric History:   No previous psychological problems have been observed Outpatient  Providers:NA History of Psych Hospitalization: No  Psychological Testing:  NA    Abuse History:  Victim of: No.,  NA    Report needed: No. Victim of Neglect:No. Perpetrator of  NA   Witness / Exposure to Domestic Violence: No   Protective Services Involvement: No  Witness to [employer] Violence:  No   Family History:  Family History  Problem Relation Age of Onset   Leukemia Mother    Diabetes Mother    Hearing loss Mother    Hearing loss Father    Stroke Father    Diabetes Father    Lung cancer Maternal Grandfather    Diabetes Paternal Grandfather     Living situation: the patient lives with their spouse  Sexual Orientation: Straight  Relationship Status: married  Name of spouse / other:Cindy. This is his second marriage If a parent, number of children / ages:NA  Support Systems: spouse  Surveyor, quantity Stress:  No   Income/Employment/Disability: Employment  Financial planner: No   Educational History: Education: some college  Religion/Sprituality/World View: NA  Any cultural differences that may affect / interfere with treatment:  not applicable   Recreation/Hobbies: Working on his car  Stressors: Other: Concern over the direction of the world    Strengths: Supportive Relationships  Barriers:  NA   Legal History: Pending legal issue / charges: The patient has no significant history of legal issues. 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 (Patient not taking: Reported on 11/09/2021) 90 capsule 1   Omega-3 Fatty Acids (FISH OIL) 1000 MG CAPS Take by mouth daily.     OVER THE COUNTER MEDICATION daily. EMERGEN-C IMMUNE     psyllium (METAMUCIL) 58.6 % powder Take 1 packet by mouth daily.     Saw Palmetto 450 MG CAPS Take by mouth 2 (two) times daily.     VITAMIN D PO Take 125 mcg by mouth daily.     No current facility-administered medications for this visit.    No Known Allergies  Diagnoses:  F41.1 generalized anxiety disorder  Plan of Care: The patient is a 71 year old Caucasian male who was referred due to experiencing anxiety and challenges controlling anger. He lives at home with his wife and dogs. The patient meets criteria for a diagnosis of F41.1 generalized anxiety disorder based off of the following: irritability, anger, racing thoughts, feeling overwhelmed, difficulty controlling worries, feeling restless, and feeling on edge. He denied suicidal and homicidal ideation. It is also possible that the patient may meet criteria for a diagnosis of depression or other disorder, however more information is needed.   The patient stated that he wants coping skills and to work on living in a world that does not align with his values.  This psychologist makes the recommendation that the patient participate in therapy bi-weekly.    Shannon Corrigan, PsyD

## 2022-01-18 NOTE — Plan of Care (Signed)

## 2022-02-06 ENCOUNTER — Telehealth: Payer: Self-pay

## 2022-02-06 NOTE — Telephone Encounter (Signed)
Patient declined AWV at this time.  He states he has some personal issues going on for the past few months.  He mentioned his wife just left him. He scheduled an appt to see Dr. Milinda Cave on Friday 9/15.

## 2022-02-06 NOTE — Telephone Encounter (Signed)
Called pt to schedule AWV. Please schedule with health coach or Jerren Flinchbaugh.  

## 2022-02-10 ENCOUNTER — Ambulatory Visit (INDEPENDENT_AMBULATORY_CARE_PROVIDER_SITE_OTHER): Payer: Medicare Other | Admitting: Family Medicine

## 2022-02-10 ENCOUNTER — Encounter: Payer: Self-pay | Admitting: Family Medicine

## 2022-02-10 VITALS — BP 129/72 | HR 78 | Temp 98.6°F | Ht 68.5 in | Wt 174.2 lb

## 2022-02-10 DIAGNOSIS — R632 Polyphagia: Secondary | ICD-10-CM | POA: Diagnosis not present

## 2022-02-10 DIAGNOSIS — R4689 Other symptoms and signs involving appearance and behavior: Secondary | ICD-10-CM

## 2022-02-10 DIAGNOSIS — Z23 Encounter for immunization: Secondary | ICD-10-CM | POA: Diagnosis not present

## 2022-02-10 DIAGNOSIS — R454 Irritability and anger: Secondary | ICD-10-CM

## 2022-02-10 DIAGNOSIS — R4587 Impulsiveness: Secondary | ICD-10-CM | POA: Diagnosis not present

## 2022-02-10 LAB — COMPREHENSIVE METABOLIC PANEL
ALT: 13 U/L (ref 0–53)
AST: 15 U/L (ref 0–37)
Albumin: 3.9 g/dL (ref 3.5–5.2)
Alkaline Phosphatase: 33 U/L — ABNORMAL LOW (ref 39–117)
BUN: 12 mg/dL (ref 6–23)
CO2: 28 mEq/L (ref 19–32)
Calcium: 8.8 mg/dL (ref 8.4–10.5)
Chloride: 104 mEq/L (ref 96–112)
Creatinine, Ser: 0.79 mg/dL (ref 0.40–1.50)
GFR: 91 mL/min (ref >60.00)
Glucose, Bld: 102 mg/dL — ABNORMAL HIGH (ref 70–99)
Potassium: 4.1 mEq/L (ref 3.5–5.1)
Sodium: 138 mEq/L (ref 135–145)
Total Bilirubin: 0.7 mg/dL (ref 0.2–1.2)
Total Protein: 6.6 g/dL (ref 6.0–8.3)

## 2022-02-10 LAB — CBC
HCT: 41 % (ref 39.0–52.0)
Hemoglobin: 13.8 g/dL (ref 13.0–17.0)
MCHC: 33.7 g/dL (ref 30.0–36.0)
MCV: 91.3 fl (ref 78.0–100.0)
Platelets: 265 10*3/uL (ref 150.0–400.0)
RBC: 4.49 Mil/uL (ref 4.22–5.81)
RDW: 12.6 % (ref 11.5–15.5)
WBC: 5.9 10*3/uL (ref 4.0–10.5)

## 2022-02-10 LAB — HEMOGLOBIN A1C: Hgb A1c MFr Bld: 5.6 % (ref 4.6–6.5)

## 2022-02-10 LAB — TSH: TSH: 1.02 u[IU]/mL (ref 0.35–5.50)

## 2022-02-10 MED ORDER — FLUOXETINE HCL 10 MG PO CAPS: 10.0000 mg | ORAL_CAPSULE | Freq: Every day | ORAL | 1 refills | Status: DC

## 2022-02-10 NOTE — Progress Notes (Signed)
OFFICE VISIT  02/10/2022  CC:  Chief Complaint  Patient presents with   Anxiety   Patient is a 71 y.o. male who presents for follow-up with his anger control concerns. I last saw him 3 months ago. A/P as of that visit: "#1 difficulty controlling anger, chronic worry. He is doing much improved off fluoxetine. It seems this medication was at least in part contributing to the gradual worsening of his psychological symptoms. No new medications today. He never got contacted about counseling referral so I will check into this today".  INTERIM HX: Shannon King is here with his wife Caren Griffins today. He continues to have periods of a few weeks feeling okay and not having anger outbursts.  He then says this leads to his aggravation building and building and then he finally explodes and it is almost always at his wife.  He does not get violent but his wife describes outbursts that are quite scary to her and they have been staying in separate places for the last week. He sometimes says horrible things in the heat of the moment, things like threatening to kill himself in front of her or burn the house down. He did get to see a psychologist, Dr. Michail Sermon, but patient feels like nothing has been done to help him yet. [name] says that he is frustrated with the way the world is going right now--the news on TV and radio getting frustrated, even is frustrated by the way people drive in the way people dress and the way people are always on the phone all the time.  Patient and wife are very frustrated with the whole situation.  He denies feeling classic depressed mood but admits to feeling down about his relationship with his wife and wants to get better so he can stay with her. He does not abuse alcohol, but typically drinks a couple beers a night. No drug use. [name] says that he did grow up in a house with pretty strict rules and lots of criticism if any of them were broken.  Lots of outbursts similar to what he  does.  He says he did not get physically or sexually abused. [name] denies ever having any significant periods of elation/euphoria, excessive goal-directed behavior, depression, or pressured speech (other than when very angry).  Denies hallucinations or delusions.  He thinks maybe the fluoxetine in the past at the 10 mg dose helped him little bit with feeling less aggravated all the time and having less outbursts.  However, it had not helped to a significant degree, hence our attempts at increasing his dose some.  He feels like the 20 mg fluoxetine dose made him feel worse. He has not been on any of this medication in the last few months.  [name] and his wife request he be checked for any medical illnesses that may be leading to his anger outbursts and also request a neurology referral to see if anything further needs to be done from that standpoint. Their concern is that, although he has struggled with anger control problems all his life, that it is progressing and escalating more over the last year or so. They feel like it may be indicative of an underlying medical or neurologic disorder. His wife is particularly concerned about his craving for lots of sugar and his family history of diabetes.  PMP AWARE reviewed today: nothing is listed No red flags.  ROS as above, plus--> no fevers, no CP, no SOB, no wheezing, no cough, no dizziness, no HAs, no  rashes, no melena/hematochezia.  No polyuria or polydipsia.  No myalgias or arthralgias.  No focal weakness, paresthesias, or tremors.  No acute vision or hearing abnormalities.  No dysuria or unusual/new urinary urgency or frequency.  No recent changes in lower legs. No n/v/d or abd pain.  No palpitations.    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   Difficulty controlling anger    Hypercholesterolemia 03/2019   pt declined statin trial 03/2019, 04/2020, 05/2021     Past Surgical History:  Procedure Laterality Date   COLONOSCOPY  09/21/2014   adenomas->recall 3-5 yrs   [place]  2011   Normal   TONSILLECTOMY AND ADENOIDECTOMY  1965   Social History   Socioeconomic History   Marital status: Married    Spouse name: Not on file   Number of children: Not on file   Years of education: Not on file   Highest education level: Not on file  Occupational History   Not on file  Tobacco Use   Smoking status: Former    Types: Cigarettes    Quit date: 04/02/2012    Years since quitting: 9.8   Smokeless tobacco: Never  Vaping Use   Vaping Use: Never used  Substance and Sexual Activity   Alcohol use: Yes    Alcohol/week: 1.0 - 2.0 standard drink of alcohol    Types: 1 - 2 Cans of beer per week   Drug use: Never   Sexual activity: Not on file  Other Topics Concern   Not on file  Social History Narrative   Married, no children.   Relocated to [place] from [place] 2019.     Educ: 2 yrs college   Occup: "Team [employer]  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   Former smoker: 20 pack-yr hx, quit 2013.   Alcohol: 2 beers per day, rare liquor.      Social Determinants of Health   Financial Resource Strain: Not on file  Food Insecurity: Not on file  Transportation Needs: Not on file  Physical Activity: Not on file  Stress: Not on file  Social Connections: Not on file     Outpatient Medications Prior to Visit  Medication Sig Dispense Refill   Omega-3 Fatty Acids (FISH OIL) 1000 MG CAPS Take by mouth daily.     OVER THE COUNTER MEDICATION daily. EMERGEN-C IMMUNE     Saw Palmetto 450 MG CAPS Take by mouth 2 (two) times daily.     VITAMIN D PO Take 125 mcg by mouth daily.     psyllium (METAMUCIL) 58.6 % powder Take 1 packet by mouth daily. (Patient not taking: Reported on 02/10/2022)     FLUoxetine (PROZAC) 40 MG capsule TAKE 1 CAPSULE (40 MG TOTAL)  BY MOUTH DAILY. (Patient not taking: Reported on 11/09/2021) 90 capsule 1   No facility-administered medications prior to visit.    No Known Allergies  ROS As per HPI  PE:    02/10/2022   12:59 PM 11/09/2021    8:25 AM 10/12/2021    2:06 PM  Vitals with BMI  Height 5' 8.5" 5' 8.5" 5' 8.5"  Weight 174 lbs 3 oz 177 lbs 10 oz 178 lbs 3 oz  BMI 26.1 77.41 28.7  Systolic 867 672 094  Diastolic 72 76 73  Pulse  78 71 67     Physical Exam  Gen: Alert, well appearing.  Patient is oriented to person, place, time, and situation. Affect: Slightly morose.  Makes normal eye contact.  No pressured speech.  He gets a little bit emotional talking to his wife in front of me today.  Otherwise he is in good control of emotions. Neuro: CN 2-12 intact bilaterally, strength 5/5 in proximal and distal upper extremities and lower extremities bilaterally.    No tremor.  No disdiadochokinesis.  No ataxia.   LABS:  Last CBC Lab Results  Component Value Date   WBC 7.2 06/02/2021   HGB 14.1 06/02/2021   HCT 42.4 06/02/2021   MCV 92.2 06/02/2021   MCH 30.5 04/11/2019   RDW 12.9 06/02/2021   PLT 256.0 29/06/1113   Last metabolic panel Lab Results  Component Value Date   GLUCOSE 96 06/02/2021   NA 137 06/02/2021   K 4.1 06/02/2021   CL 103 06/02/2021   CO2 27 06/02/2021   BUN 13 06/02/2021   CREATININE 0.78 06/02/2021   CALCIUM 8.8 06/02/2021   PROT 6.5 06/02/2021   ALBUMIN 3.9 06/02/2021   BILITOT 0.7 06/02/2021   ALKPHOS 37 (L) 06/02/2021   AST 14 06/02/2021   ALT 15 06/02/2021   Lab Results  Component Value Date   CHOL 190 06/02/2021   HDL 41.80 06/02/2021   LDLCALC 134 (H) 06/02/2021   TRIG 72.0 06/02/2021   CHOLHDL 5 06/02/2021   IMPRESSION AND PLAN:  #1 difficulty controlling anger. Per patient and wife's report this is progressing over the last 1 year.  Restart fluoxetine 10 mg/day.  Continue counseling with Dr. Michail Sermon. Check general lab panel today: CBC, c-Met, TSH,  hemoglobin A1c. Referral to neurology today per patient and wife request.  Spent 25 min with pt today reviewing HPI, reviewing relevant past history, doing exam, and formulating plans.  An After Visit Summary was printed and given to the patient.  FOLLOW UP: Return in about 4 weeks (around 03/10/2022) for f/u anger control problems.  Signed:  Crissie Sickles, MD           02/10/2022

## 2022-02-13 ENCOUNTER — Telehealth: Payer: Self-pay | Admitting: Family Medicine

## 2022-02-13 NOTE — Telephone Encounter (Signed)
noted 

## 2022-02-13 NOTE — Telephone Encounter (Signed)
Advised pt all labs were normal based on Dr. Anitra Lauth note. Patient was fine with that and no additional questions.

## 2022-02-14 ENCOUNTER — Telehealth: Payer: Self-pay | Admitting: Family Medicine

## 2022-02-14 NOTE — Telephone Encounter (Signed)
Pt would like advice or a suggestion on a Marriage counselor

## 2022-02-14 NOTE — Telephone Encounter (Signed)
[facility] in [place], [place], or [place]

## 2022-02-14 NOTE — Telephone Encounter (Signed)
Please advise 

## 2022-02-14 NOTE — Telephone Encounter (Signed)
LM for pt to return call to discuss.  

## 2022-02-16 NOTE — Telephone Encounter (Signed)
LM for pt to return call regarding recommendations. See result note as well.

## 2022-02-17 NOTE — Telephone Encounter (Signed)
LM for pt to return call to discuss.  

## 2022-02-17 NOTE — Telephone Encounter (Signed)
Pt states that he had found a Social worker.

## 2022-02-20 ENCOUNTER — Ambulatory Visit (INDEPENDENT_AMBULATORY_CARE_PROVIDER_SITE_OTHER): Payer: Medicare Other | Admitting: Psychologist

## 2022-02-20 DIAGNOSIS — F411 Generalized anxiety disorder: Secondary | ICD-10-CM | POA: Diagnosis not present

## 2022-02-20 NOTE — Progress Notes (Signed)
                Mark Hassey, PsyD 

## 2022-02-20 NOTE — Progress Notes (Signed)
[facility] Counselor/Therapist Progress Note  Patient ID: Shannon King, MRN: [id number],    Date: 02/20/2022  Time Spent: 08:07 am to 08:45 am; total time: 38 minutes   This session was held via in person. The patient consented to in-person therapy and was in the clinician's office. Limits of confidentiality were discussed with the patient.   Treatment Type: Individual Therapy  Reported Symptoms: Stress as wife has separated from him  Mental Status Exam: Appearance:  Well Groomed     Behavior: Appropriate  Motor: Normal  Speech/Language:  Clear and Coherent  Affect: Appropriate  Mood: normal  Thought process: normal  Thought content:   WNL  Sensory/Perceptual disturbances:   WNL  Orientation: oriented to person, place, and time/date  Attention: Good  Concentration: Good  Memory: WNL  Fund of knowledge:  Good  Insight:   Poor  Judgment:  Fair  Impulse Control: Poor   Risk Assessment: Danger to Self:  No Self-injurious Behavior: No Danger to Others: No Duty to Warn:no Physical Aggression / Violence:No  Access to Firearms a concern: No  Gang Involvement:No   Subjective: Beginning the session, patient described himself as doing poorly as his wife has separated to him following a recent argument. After reviewing the treatment plan, patient stated that he wants coping skills. After discussing and practicing coping skills, the patient described himself as better. He was agreeable to homework and following up. He denied suicidal and homicidal ideation.    Interventions:  Worked on developing a therapeutic relationship with the patient using active listening and reflective statements. Provided emotional support using empathy and validation. Reviewed the treatment plan with the patient. Reviewed events since the intake occurred. Validated expressed thoughts. Identified goals for the session. Provided psychoeducation about mindfulness, guided imagery, and  externalizing thoughts. Practicing and processed mindfulness and guided imagery. Praised patient for experiencing less distress. Assisted in problem solving. Assigned homework. Assessed for suicidal and homicidal ideation.   Homework: Implement mindfulness and guided imagery. Write out concerns  Next Session: Review homework, emotional support. Defusion, PMR.   Diagnosis: F41.1 generalized anxiety disorder  Plan:   Goals Reduce overall frequency, intensity, and duration of anxiety Stabilize anxiety level wile increasing ability to function Enhance ability to effectively cope with full variety of stressors Learn and implement coping skills that result in a reduction of anxiety   Objectives target date for all objectives is  01/19/2023 Verbalize an understanding of the cognitive, physiological, and behavioral components of anxiety Learning and implement calming skills to reduce overall anxiety Verbalize an understanding of the role that cognitive biases play in excessive irrational worry and persistent anxiety symptoms Identify, challenge, and replace based fearful talk Learn and implement problem solving strategies Identify and engage in pleasant activities Learning and implement personal and interpersonal skills to reduce anxiety and improve interpersonal relationships Learn to accept limitations in life and commit to tolerating, rather than avoiding, unpleasant emotions while accomplishing meaningful goals Identify major life conflicts from the past and present that form the basis for present anxiety Maintain involvement in work, family, and social activities Reestablish a consistent sleep-wake cycle Cooperate with a medical evaluation  Interventions Engage the patient in behavioral activation Use instruction, modeling, and role-playing to build the client's general social, communication, and/or conflict resolution skills Use Acceptance and Commitment Therapy to help client accept  uncomfortable realities in order to accomplish value-consistent goals Reinforce the client's insight into the role of his/her past emotional pain and present anxiety  Support the  client in following through with work, family, and social activities Teach and implement sleep hygiene practices  Refer the patient to a physician for a psychotropic medication consultation Monito the clint's psychotropic medication compliance Discuss how anxiety typically involves excessive worry, various bodily expressions of tension, and avoidance of what is threatening that interact to maintain the problem  Teach the patient relaxation skills Assign the patient homework Discuss examples demonstrating that unrealistic worry overestimates the probability of threats and underestimates patient's ability  Assist the patient in analyzing his or her worries Help patient understand that avoidance is reinforcing   The patient and clinician reviewed the treatment plan on 02/20/2022. The patient approved of the treatment plan.   Conception Chancy, PsyD

## 2022-03-04 ENCOUNTER — Other Ambulatory Visit: Payer: Self-pay | Admitting: Family Medicine

## 2022-03-09 ENCOUNTER — Ambulatory Visit (INDEPENDENT_AMBULATORY_CARE_PROVIDER_SITE_OTHER): Payer: Medicare Other | Admitting: Family Medicine

## 2022-03-09 ENCOUNTER — Encounter: Payer: Self-pay | Admitting: Family Medicine

## 2022-03-09 VITALS — Ht 68.5 in | Wt 174.6 lb

## 2022-03-09 DIAGNOSIS — R454 Irritability and anger: Secondary | ICD-10-CM

## 2022-03-09 MED ORDER — FLUOXETINE HCL 10 MG PO CAPS: 10.0000 mg | ORAL_CAPSULE | Freq: Every day | ORAL | 1 refills | Status: DC

## 2022-03-09 NOTE — Progress Notes (Signed)
OFFICE VISIT  03/09/2022  CC:  Chief Complaint  Patient presents with   Follow-up    mood    Patient is a 71 y.o. male who presents for 1 month follow-up difficulty controlling anger. A/P as of last visit: "#1 difficulty controlling anger. Per patient and wife's report this is progressing over the last 1 year.   Restart fluoxetine 10 mg/day.  Continue counseling with Dr. Michail Sermon. Check general lab panel today: CBC, c-Met, TSH, hemoglobin A1c. Referral to neurology today per patient and wife request."  INTERIM HX: All labs normal last visit. He is doing much better.  He and his wife have had to marriage counseling appointments now. They are working through things better and she is going to move back in with him in a few days. He does think the fluoxetine has helped significantly. He continues to go to his own Artist and thinks he may do this a couple more times. He is learning ways to work through some of his anger.    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     OVER THE COUNTER MEDICATION daily. EMERGEN-C IMMUNE     Saw Palmetto 450 MG CAPS Take by mouth 2 (two) times daily.     VITAMIN D PO Take 125 mcg by mouth daily.     psyllium (METAMUCIL) 58.6 % powder Take 1 packet by mouth daily. (Patient not taking: Reported on 02/10/2022)     FLUoxetine (PROZAC) 10 MG capsule Take 1 capsule (10 mg total) by  mouth daily. 30 capsule 1   No facility-administered medications prior to visit.    No Known Allergies  ROS As per HPI  PE:    03/09/2022   12:58 PM 02/10/2022   12:59 PM 11/09/2021    8:25 AM  Vitals with BMI  Height 5' 8.5" 5' 8.5" 5' 8.5"  Weight 174 lbs 10 oz 174 lbs 3 oz 177 lbs 10 oz  BMI 26.16 22.9 79.89  Systolic  211 941  Diastolic  72 76  Pulse  78 71     Physical Exam  Gen: Alert, well appearing.  Patient is oriented to person, place, time, and situation. AFFECT: pleasant, lucid thought and speech. No further exam today.  LABS:  Last CBC Lab Results  Component Value Date   WBC 5.9 02/10/2022   HGB 13.8 02/10/2022   HCT 41.0 02/10/2022   MCV 91.3 02/10/2022   MCH 30.5 04/11/2019   RDW 12.6 02/10/2022   PLT 265.0 74/12/1446   Last metabolic panel Lab Results  Component Value Date   GLUCOSE 102 (H) 02/10/2022   NA 138 02/10/2022   K 4.1 02/10/2022   CL 104 02/10/2022   CO2 28 02/10/2022   BUN 12 02/10/2022   CREATININE 0.79 02/10/2022   CALCIUM 8.8 02/10/2022   PROT 6.6 02/10/2022   ALBUMIN 3.9 02/10/2022  BILITOT 0.7 02/10/2022   ALKPHOS 33 (L) 02/10/2022   AST 15 02/10/2022   ALT 13 02/10/2022   Last lipids Lab Results  Component Value Date   CHOL 190 06/02/2021   HDL 41.80 06/02/2021   LDLCALC 134 (H) 06/02/2021   TRIG 72.0 06/02/2021   CHOLHDL 5 06/02/2021   Last hemoglobin A1c Lab Results  Component Value Date   HGBA1C 5.6 02/10/2022   Last thyroid functions Lab Results  Component Value Date   TSH 1.02 02/10/2022   Lab Results  Component Value Date   PSA 0.81 06/02/2021   PSA 0.77 05/18/2020   IMPRESSION AND PLAN:  1 difficulty controlling anger. Improved on fluoxetine 10 mg a day and counseling with wife and personal counseling. He is happy with where he is and we will reevaluate in about 6 months.  An After Visit Summary was printed and given to the patient.  FOLLOW UP: Return in about 6 months (around  09/08/2022) for annual CPE (fasting).  Signed:  Crissie Sickles, MD           03/09/2022

## 2022-03-15 ENCOUNTER — Encounter: Payer: Self-pay | Admitting: Neurology

## 2022-03-15 ENCOUNTER — Telehealth: Payer: Self-pay | Admitting: Neurology

## 2022-03-15 ENCOUNTER — Ambulatory Visit: Payer: Medicare Other | Admitting: Neurology

## 2022-03-15 VITALS — BP 105/66 | HR 65 | Ht 70.0 in | Wt 171.6 lb

## 2022-03-15 DIAGNOSIS — R454 Irritability and anger: Secondary | ICD-10-CM

## 2022-03-15 DIAGNOSIS — Z8489 Family history of other specified conditions: Secondary | ICD-10-CM

## 2022-03-15 DIAGNOSIS — Z823 Family history of stroke: Secondary | ICD-10-CM | POA: Diagnosis not present

## 2022-03-15 NOTE — Patient Instructions (Signed)
It was nice to meet you today, I do not detect any abnormalities on your neurological exam.  Nevertheless, to rule out an organic and structural cause of your symptoms, I would like to proceed with additional testing from my end of things:  We will check blood work today and call you with the test results. We will do an EEG (brainwave test), which we will schedule. We will call you with the results. We will do a brain scan, called MRI and call you with the test results. We will have to schedule you for this on a separate date. This test requires authorization from your insurance, and we will take care of the insurance process.  For now, we will keep you posted as to your test results by phone call.  We will plan to follow-up accordingly.
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
## 2022-03-15 NOTE — Telephone Encounter (Signed)
[facility] sent to GI [phone]

## 2022-03-18 LAB — ANA W/REFLEX: Anti Nuclear Antibody (ANA): NEGATIVE

## 2022-03-18 LAB — AMMONIA: Ammonia: 33 ug/dL — ABNORMAL LOW (ref 40–200)

## 2022-03-18 LAB — SEDIMENTATION RATE: Sed Rate: 2 mm/hr (ref 0–30)

## 2022-03-18 LAB — RHEUMATOID FACTOR: Rheumatoid fact SerPl-aCnc: <10 IU/mL (ref <14.0)

## 2022-03-18 LAB — VITAMIN B6: Vitamin B6: 77.5 ug/L — ABNORMAL HIGH (ref 3.4–65.2)

## 2022-03-18 LAB — VITAMIN B1: Thiamine: 103.8 nmol/L (ref 66.5–200.0)

## 2022-03-18 LAB — VITAMIN D 25 HYDROXY (VIT D DEFICIENCY, FRACTURES): Vit D, 25-Hydroxy: 57.1 ng/mL (ref 30.0–100.0)

## 2022-03-18 LAB — C-REACTIVE PROTEIN: CRP: 2 mg/L (ref 0–10)

## 2022-03-18 LAB — RPR: RPR Ser Ql: NONREACTIVE

## 2022-03-20 ENCOUNTER — Telehealth: Payer: Self-pay

## 2022-03-20 ENCOUNTER — Ambulatory Visit (INDEPENDENT_AMBULATORY_CARE_PROVIDER_SITE_OTHER): Payer: Medicare Other | Admitting: Psychologist

## 2022-03-20 DIAGNOSIS — F411 Generalized anxiety disorder: Secondary | ICD-10-CM | POA: Diagnosis not present

## 2022-03-20 NOTE — Progress Notes (Signed)
[facility] Counselor/Therapist Progress Note  Patient ID: Shannon King, MRN: [id number],    Date: 03/20/2022  Time Spent: 08:05 am to 08:30 am; total time: 25 minutes   This session was held via in person. The patient consented to in-person therapy and was in the clinician's office. Limits of confidentiality were discussed with the patient.   Treatment Type: Individual Therapy  Reported Symptoms: Denied symptoms   Mental Status Exam: Appearance:  Well Groomed     Behavior: Appropriate  Motor: Normal  Speech/Language:  Clear and Coherent  Affect: Appropriate  Mood: normal  Thought process: normal  Thought content:   WNL  Sensory/Perceptual disturbances:   WNL  Orientation: oriented to person, place, and time/date  Attention: Good  Concentration: Good  Memory: WNL  Fund of knowledge:  Good  Insight:   Poor  Judgment:  Fair  Impulse Control: Poor   Risk Assessment: Danger to Self:  No Self-injurious Behavior: No Danger to Others: No Duty to Warn:no Physical Aggression / Violence:No  Access to Firearms a concern: No  Gang Involvement:No   Subjective: Beginning the session, patient described himself as doing well as he and his wife have reunited. From there, he talked about how he learned to allow "things to roll off" that he has no control over. He processed thoughts and emotions. He talked about potential obstacles and how to overcome them. He was agreeable to following up. He denied suicidal and homicidal ideation.    Interventions:  Worked on developing a therapeutic relationship with the patient using active listening and reflective statements. Provided emotional support using empathy and validation. Reviewed the treatment plan with the patient. Praised the patient for doing well. Explored what has assisted the patient. Reviewed events since the last session. Reflected on how the patient has been able to use strategies previously discussed. Used  socratic questions to assist the patient gain insight. Discussed potential obstacles and how to overcome those obstacles. Provided psychoeducation about next steps for counseling. Praised patient for quick response from counseling. Provided empathic statements. Assessed for suicidal and homicidal ideation.   Homework: NA  Next Session: Emotional support. Possibly terminate  Diagnosis: F41.1 generalized anxiety disorder  Plan:   Goals Reduce overall frequency, intensity, and duration of anxiety Stabilize anxiety level wile increasing ability to function Enhance ability to effectively cope with full variety of stressors Learn and implement coping skills that result in a reduction of anxiety   Objectives target date for all objectives is  01/19/2023 Verbalize an understanding of the cognitive, physiological, and behavioral components of anxiety Learning and implement calming skills to reduce overall anxiety Verbalize an understanding of the role that cognitive biases play in excessive irrational worry and persistent anxiety symptoms Identify, challenge, and replace based fearful talk Learn and implement problem solving strategies Identify and engage in pleasant activities Learning and implement personal and interpersonal skills to reduce anxiety and improve interpersonal relationships Learn to accept limitations in life and commit to tolerating, rather than avoiding, unpleasant emotions while accomplishing meaningful goals Identify major life conflicts from the past and present that form the basis for present anxiety Maintain involvement in work, family, and social activities Reestablish a consistent sleep-wake cycle Cooperate with a medical evaluation  Interventions Engage the patient in behavioral activation Use instruction, modeling, and role-playing to build the client's general social, communication, and/or conflict resolution skills Use Acceptance and Commitment Therapy to help  client accept uncomfortable realities in order to accomplish value-consistent goals Reinforce the client's  insight into the role of his/her past emotional pain and present anxiety  Support the client in following through with work, family, and social activities Teach and implement sleep hygiene practices  Refer the patient to a physician for a psychotropic medication consultation Monito the clint's psychotropic medication compliance Discuss how anxiety typically involves excessive worry, various bodily expressions of tension, and avoidance of what is threatening that interact to maintain the problem  Teach the patient relaxation skills Assign the patient homework Discuss examples demonstrating that unrealistic worry overestimates the probability of threats and underestimates patient's ability  Assist the patient in analyzing his or her worries Help patient understand that avoidance is reinforcing   The patient and clinician reviewed the treatment plan on 02/20/2022. The patient approved of the treatment plan.   Hilbert Corrigan, PsyD

## 2022-03-20 NOTE — Telephone Encounter (Signed)
-----   Message from Star Age, MD sent at 03/20/2022 11:53 AM EDT ----- Please call patient and advise him that his labs were benign, but his vitamin B6 level was elevated, if he is taking any vitamin B6 supplement or B complex over-the-counter, I recommend that he stop taking this for now.

## 2022-03-20 NOTE — Telephone Encounter (Signed)
I called patient to discuss. No answer, left a message asking him to call us back.  

## 2022-03-21 ENCOUNTER — Telehealth: Payer: Self-pay

## 2022-03-21 NOTE — Telephone Encounter (Signed)
I called patient.  I discussed his lab results and recommendations.  He denies taking any vitamins with vitamin B6 included  He will follow-up with this with his PCP in a few months.  Patient verbalized understanding of results and recommendations.

## 2022-03-21 NOTE — Telephone Encounter (Signed)
Please review and advise.

## 2022-03-21 NOTE — Telephone Encounter (Signed)
Patient would like Dr. Anitra Lauth to review his Vit B6 results from Dr. Rexene Alberts. Patient is wondering if OTC medication Emergen - C immune could cause B6 levels to be high.  Patient denied appt. Please call [phone]

## 2022-03-21 NOTE — Telephone Encounter (Signed)
Pt returned a call from nurse. Said his phone is not working at the time but please call him on his wife phone at [phone].

## 2022-03-24 ENCOUNTER — Telehealth: Payer: Self-pay | Admitting: Family Medicine

## 2022-03-24 NOTE — Telephone Encounter (Signed)
Pt came in office very upset about the fact that he has not received an answer from Dr. Anitra Lauth about his blood work done drom another Dr. He has made a visit to the office 3 times this week. I informed him that the message has been routed to Dr. Anitra Lauth and he is aware of the message, however I informed him he may advise him to schedule an office visit anyway, and offered to get him scheduled. This is when Mr. Hollibaugh became even more upset and stated that "so you are telling me in order to figure out what is wrong with me I have to schedule an appt?" I informed him that would be this best way to discuss concerns. Mr. Vercher then proceed to say tell Dr. Anitra Lauth if I do not hear from him today tell him I will be getting a new Dr. I responded ok and that's when he left.

## 2022-03-24 NOTE — Telephone Encounter (Signed)
Genera, pls dismiss patient

## 2022-03-24 NOTE — Telephone Encounter (Signed)
FYI  Please see below

## 2022-03-27 ENCOUNTER — Encounter: Payer: Self-pay | Admitting: Family Medicine

## 2022-03-27 ENCOUNTER — Telehealth: Payer: Self-pay | Admitting: *Deleted

## 2022-03-27 ENCOUNTER — Ambulatory Visit: Payer: Medicare Other | Admitting: Neurology

## 2022-03-27 DIAGNOSIS — R451 Restlessness and agitation: Secondary | ICD-10-CM | POA: Diagnosis not present

## 2022-03-27 DIAGNOSIS — Z823 Family history of stroke: Secondary | ICD-10-CM

## 2022-03-27 DIAGNOSIS — R454 Irritability and anger: Secondary | ICD-10-CM

## 2022-03-27 DIAGNOSIS — Z8489 Family history of other specified conditions: Secondary | ICD-10-CM

## 2022-03-27 NOTE — Telephone Encounter (Signed)
Noted  

## 2022-03-27 NOTE — Procedures (Signed)
    History:  71 year old man with changes in behavior   EEG classification: Awake and drowsy  Description of the recording: The background rhythms of this recording consists of a fairly symmetric low amplitude with no clear evidence of a PRD. Present in the anterior head region is a 15-20 Hz beta activity. Photic stimulation was performed, did not show any abnormalities. Hyperventilation was also performed, did not show any abnormalities. Drowsiness was manifested by background fragmentation. No abnormal epileptiform discharges seen during this recording. There was no focal slowing. There were no electrographic seizure identified.   Abnormality: None   Impression: This is an essentially normal EEG recorded while drowsy and awake. No evidence of interictal epileptiform discharges. Normal EEGs, however, do not rule out epilepsy.    Alric Ran, MD [facility]

## 2022-03-27 NOTE — Telephone Encounter (Signed)
-----   Message from Star Age, MD sent at 03/27/2022  5:09 PM EDT ----- Please call and advise the patient that the EEG or brain wave test we performed was reported as normal in the awake and drowsy states. We checked for abnormal electrical discharges in the brain waves and the report suggested normal findings. No further action is required on this test at this time. Please remind patient to keep any upcoming appointments or tests and to call us with any interim questions, concerns, problems or updates. Thanks,  Star Age, MD, PhD

## 2022-03-27 NOTE — Telephone Encounter (Addendum)
Spoke to patient gave EEG results. Gave Dr Tori Milks recommendation to keep all upcoming appointments and test  . Pt states does he still need to go to MRI testing  Wednesday . Informed patient again yes he needs to go Wednesday  to  get MRI  done and keep any other  future appointments and test. Pt expressed understanding and thanked me for calling.

## 2022-03-29 ENCOUNTER — Other Ambulatory Visit: Payer: Self-pay | Admitting: Neurology

## 2022-03-29 ENCOUNTER — Ambulatory Visit
Admission: RE | Admit: 2022-03-29 | Discharge: 2022-03-29 | Disposition: A | Discharge status: Home / Self Care | Payer: Medicare Other | Source: Ambulatory Visit | Attending: Neurology | Admitting: Neurology

## 2022-03-29 DIAGNOSIS — Z823 Family history of stroke: Secondary | ICD-10-CM

## 2022-03-29 DIAGNOSIS — R454 Irritability and anger: Secondary | ICD-10-CM

## 2022-03-29 DIAGNOSIS — Z8489 Family history of other specified conditions: Secondary | ICD-10-CM

## 2022-03-31 NOTE — Telephone Encounter (Signed)
Patient called regarding dismissal letter he received today in the mail.  He asked "what does this mean, I am unable to come to you with my healthcare needs".  He apologized because he knew why this happened, but more so aggravated because his health concerns were not being addressed and questions answered, and he got dismissed for voicing his concerns.  He said "you were on the phone when all this happened, so you might not have heard the entire conversation when I was speaking to your co-worker that sits beside you.  I was getting upset because I was having to repeat myself because I was not able to hear her with "that mask on." And then she said that I needed to make an appt to go over lab results(note: labs ordered from outside doctor not by Dr. Anitra Lauth)  Go ahead and lets make this appt with Dr. Amaryllis Dyke I can get some answers, and we can just forget all this and start again. I had an MRI and I dont know the results of it yet either. (MRI ordered from another provider not Dr. Anitra Lauth)."  I told patient I needed approval to make appt, because we can provide "urgent and acute care" for 30 days.  I told patient that my manager was out of office today but I will have her call upon her return. He agreed and thanked me.  Shannon King can be reached at [phone]

## 2022-04-03 ENCOUNTER — Telehealth: Payer: Self-pay | Admitting: Neurology

## 2022-04-03 NOTE — Telephone Encounter (Signed)
Ok to make appt

## 2022-04-03 NOTE — Telephone Encounter (Signed)
FYI  Please see below

## 2022-04-03 NOTE — Telephone Encounter (Signed)
Patient has called again today follow up on the request to hear from the office manager([employer]). He called Friday and is follow up on this dismissal letter and wants to fix things and apologize to Dr. Anitra Lauth.

## 2022-04-03 NOTE — Telephone Encounter (Signed)
MRI completed 03/29/22. Results not available yet.

## 2022-04-03 NOTE — Telephone Encounter (Signed)
Looks like th MRI was done 03-29-2022 at [facility].

## 2022-04-03 NOTE — Telephone Encounter (Signed)
Please assist patient with scheduling, thanks. 

## 2022-04-03 NOTE — Telephone Encounter (Signed)
Pt is asking for a call with results to his MRI

## 2022-04-03 NOTE — Telephone Encounter (Signed)
Dismissal has been removed per the approval of PCP. Please schedule patient.

## 2022-04-04 ENCOUNTER — Encounter: Payer: Self-pay | Admitting: Family Medicine

## 2022-04-04 ENCOUNTER — Ambulatory Visit (INDEPENDENT_AMBULATORY_CARE_PROVIDER_SITE_OTHER): Payer: Medicare Other | Admitting: Family Medicine

## 2022-04-04 VITALS — BP 117/72 | HR 69 | Temp 98.7°F | Ht 70.0 in | Wt 171.8 lb

## 2022-04-04 DIAGNOSIS — R454 Irritability and anger: Secondary | ICD-10-CM | POA: Diagnosis not present

## 2022-04-04 NOTE — Telephone Encounter (Signed)
I reviewed patient's brain MRI report, exam done without contrast on 03/29/2022:  IMPRESSION: This MRI of the brain without contrast shows the following:  1.  Few scattered T2/FLAIR hyperintense foci in the subcortical and deep white matter consistent with minimal chronic microvascular ischemic change, typical for age.  2.  Asymmetry of the temporal horns of the lateral ventricles, larger on the right..  Since the adjacent brain appears normal this is likely an incidental finding.  3.  No acute findings.   Please call patient and advise him that his brain MRI without contrast did not show any acute findings, chronic changes were seen, deemed typical for age, no obvious cause for his personality changes and anger outbursts seen on the current brain MRI.

## 2022-04-04 NOTE — Telephone Encounter (Signed)
I called pt and LMVM for him on mobile (ok per DPR)  of MRI results per Dr. Rexene Alberts.  Brain MRI without contrast did not show any acute findings, chronic changes were seen, deemed typical for age, no obvious cause for his personality changes and anger outbursts.  Pt is to call back if questions after 1300.

## 2022-04-04 NOTE — Progress Notes (Signed)
OFFICE VISIT  04/04/2022  CC:  Chief Complaint  Patient presents with   Discuss results    Patient is a 71 y.o. male who presents for discussion of recent neurologist eval.  INTERIM HX: Shannon King apologized today for his recent outburst of anger towards our front office staff.  Patient saw the neurologist on 03/15/2022 for further evaluation of his difficulty controlling anger.  EEG on 03/27/2022 was normal. Laboratory evaluation all normal with the exception of a mildly elevated vitamin B6 level (77.5, normal range 3.4-65.2) and mildly low ammonia (33, normal range 40-200).  MRI brain w/out contrast 03/29/22: IMPRESSION: This MRI of the brain without contrast shows the following:  1.  Few scattered T2/FLAIR hyperintense foci in the subcortical and deep white matter consistent with minimal chronic microvascular ischemic change, typical for age.  2.  Asymmetry of the temporal horns of the lateral ventricles, larger on the right..  Since the adjacent brain appears normal this is likely an incidental finding.  3.  No acute findings.    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 90 capsule 1   Omega-3 Fatty Acids (FISH OIL) 1000 MG CAPS Take by mouth daily.     psyllium (METAMUCIL) 58.6 % powder Take 1 packet by mouth as needed.     Saw Palmetto 450 MG CAPS Take by mouth 2 (two) times daily.     VITAMIN D PO Take 125  mcg by mouth daily.     OVER THE COUNTER MEDICATION daily. EMERGEN-C IMMUNE (Patient not taking: Reported on 04/04/2022)     No facility-administered medications prior to visit.    No Known Allergies  ROS As per HPI  PE:    04/04/2022    7:59 AM 03/15/2022    7:57 AM 03/09/2022   12:58 PM  Vitals with BMI  Height 5\' 10"  5\' 10"  5' 8.5"  Weight 171 lbs 13 oz 171 lbs 10 oz 174 lbs 10 oz  BMI 24.65 24.62 26.16  Systolic 117 105   Diastolic 72 66   Pulse 69 65      Physical Exam  Gen: Alert, well appearing.  Patient is oriented to person, place, time, and situation. AFFECT: pleasant, lucid thought and speech. No further exam today.  LABS:  Last CBC Lab Results  Component Value Date   WBC 5.9 02/10/2022   HGB 13.8 02/10/2022   HCT 41.0 02/10/2022   MCV 91.3 02/10/2022   MCH 30.5 04/11/2019   RDW 12.6 02/10/2022   PLT 265.0 02/10/2022   Last metabolic panel Lab Results  Component Value Date   GLUCOSE 102 (H) 02/10/2022   NA 138 02/10/2022   K 4.1 02/10/2022   CL 104 02/10/2022   CO2 28 02/10/2022   BUN 12 02/10/2022   CREATININE 0.79 02/10/2022   CALCIUM 8.8 02/10/2022   PROT 6.6 02/10/2022   ALBUMIN  3.9 02/10/2022   BILITOT 0.7 02/10/2022   ALKPHOS 33 (L) 02/10/2022   AST 15 02/10/2022   ALT 13 02/10/2022   Last thyroid functions Lab Results  Component Value Date   TSH 1.02 02/10/2022   Last vitamin D Lab Results  Component Value Date   VD25OH 57.1 03/15/2022   Lab Results  Component Value Date   ESRSEDRATE 2 03/15/2022   Lab Results  Component Value Date   CRP 2 03/15/2022   Lab Results  Component Value Date   ANA Negative 03/15/2022   RF <10.0 03/15/2022    IMPRESSION AND PLAN:  #1 difficulty controlling anger. Recent neurologist eval all reassuring. Patient was very concerned about his mildly elevated vitamin B6 level. I reassured him that this is not of any clinical significance. He does take a supplement called emergenC, which  she states has quite a bit of B vitamins in it. He states it has made him feel better and he feels like it is also prevent him from getting COVID through all the pandemic. I told him it would be fine for him to continue taking this.  He will continue Prozac 10 mg a day.   An After Visit Summary was printed and given to the patient.  FOLLOW UP: No follow-ups on file.  Signed:  Crissie Sickles, MD           04/04/2022

## 2022-07-03 ENCOUNTER — Ambulatory Visit (INDEPENDENT_AMBULATORY_CARE_PROVIDER_SITE_OTHER): Payer: Medicare Other | Admitting: Psychologist

## 2022-07-03 DIAGNOSIS — F411 Generalized anxiety disorder: Secondary | ICD-10-CM | POA: Diagnosis not present

## 2022-07-03 NOTE — Progress Notes (Signed)
[place] Counselor/Therapist Progress Note  Patient ID: Shannon King, MRN: [id number],    Date: 07/03/2022  Time Spent: 08:07 am to 08:30 am; total time: 23 minutes   This session was held via in person. The patient consented to in-person therapy and was in the clinician's office. Limits of confidentiality were discussed with the patient.   Treatment Type: Individual Therapy  Reported Symptoms: Denied symptoms   Mental Status Exam: Appearance:  Well Groomed     Behavior: Appropriate  Motor: Normal  Speech/Language:  Clear and Coherent  Affect: Appropriate  Mood: normal  Thought process: normal  Thought content:   WNL  Sensory/Perceptual disturbances:   WNL  Orientation: oriented to person, place, and time/date  Attention: Good  Concentration: Good  Memory: WNL  Fund of knowledge:  Good  Insight:   Poor  Judgment:  Fair  Impulse Control: Poor   Risk Assessment: Danger to Self:  No Self-injurious Behavior: No Danger to Others: No Duty to Warn:no Physical Aggression / Violence:No  Access to Firearms a concern: No  Gang Involvement:No   Subjective: Beginning the session, patient stated that overall he is doing well. He voiced that he has had a couple of different experiences that were hard, however for the most part, he feels like he has improved. He talked about how he is learning to let "things roll off" his back. He processed thoughts and emotions that he was experiencing. He was agreeable to following up. He denied suicidal and homicidal ideation.    Interventions:  Worked on developing a therapeutic relationship with the patient using active listening and reflective statements. Provided emotional support using empathy and validation. Reviewed the treatment plan with the patient. Praised the patient for doing well. Reviewed how the holiday season went for the patient. Normalized and validated patient's experience that sometimes there will be challenging  circumstances. Normalized having "good" and "bad" days. Used socratic questions to assist the patient. Explored if there were any barriers and if so how to overcome those barriers. Processed thoughts and emotions. Discussed next steps. Provided empathic statements. Assessed for suicidal and homicidal ideation.   Homework: NA  Next Session: Emotional support. Possibly terminate  Diagnosis: F41.1 generalized anxiety disorder  Plan:   Goals Reduce overall frequency, intensity, and duration of anxiety Stabilize anxiety level wile increasing ability to function Enhance ability to effectively cope with full variety of stressors Learn and implement coping skills that result in a reduction of anxiety   Objectives target date for all objectives is  01/19/2023 Verbalize an understanding of the cognitive, physiological, and behavioral components of anxiety Learning and implement calming skills to reduce overall anxiety Verbalize an understanding of the role that cognitive biases play in excessive irrational worry and persistent anxiety symptoms Identify, challenge, and replace based fearful talk Learn and implement problem solving strategies Identify and engage in pleasant activities Learning and implement personal and interpersonal skills to reduce anxiety and improve interpersonal relationships Learn to accept limitations in life and commit to tolerating, rather than avoiding, unpleasant emotions while accomplishing meaningful goals Identify major life conflicts from the past and present that form the basis for present anxiety Maintain involvement in work, family, and social activities Reestablish a consistent sleep-wake cycle Cooperate with a medical evaluation  Interventions Engage the patient in behavioral activation Use instruction, modeling, and role-playing to build the client's general social, communication, and/or conflict resolution skills Use Acceptance and Commitment Therapy to help  client accept uncomfortable realities in order to  accomplish value-consistent goals Reinforce the client's insight into the role of his/her past emotional pain and present anxiety  Support the client in following through with work, family, and social activities Teach and implement sleep hygiene practices  Refer the patient to a physician for a psychotropic medication consultation Monito the clint's psychotropic medication compliance Discuss how anxiety typically involves excessive worry, various bodily expressions of tension, and avoidance of what is threatening that interact to maintain the problem  Teach the patient relaxation skills Assign the patient homework Discuss examples demonstrating that unrealistic worry overestimates the probability of threats and underestimates patient's ability  Assist the patient in analyzing his or her worries Help patient understand that avoidance is reinforcing   The patient and clinician reviewed the treatment plan on 02/20/2022. The patient approved of the treatment plan.   Conception Chancy, PsyD

## 2022-07-17 ENCOUNTER — Telehealth: Payer: Self-pay

## 2022-07-17 NOTE — Telephone Encounter (Signed)
LVM for pt to call back in regards to scheduling AWV with our health coach.   07/17/2022@currenttime$ @

## 2022-07-31 ENCOUNTER — Ambulatory Visit (INDEPENDENT_AMBULATORY_CARE_PROVIDER_SITE_OTHER): Payer: Medicare Other | Admitting: Psychologist

## 2022-07-31 DIAGNOSIS — F411 Generalized anxiety disorder: Secondary | ICD-10-CM

## 2022-07-31 NOTE — Progress Notes (Signed)
[place] Counselor/Therapist Progress Note  Patient ID: Shannon King, MRN: [id number],    Date: 07/31/2022  Time Spent: 08:15 am to 08:44 am; total time: 29 minutes   This session was held via in person. The patient consented to in-person therapy and was in the clinician's office. Limits of confidentiality were discussed with the patient.   Treatment Type: Individual Therapy  Reported Symptoms: Wants to understand root cause of anger  Mental Status Exam: Appearance:  Well Groomed     Behavior: Appropriate  Motor: Normal  Speech/Language:  Clear and Coherent  Affect: Appropriate  Mood: normal  Thought process: normal  Thought content:   WNL  Sensory/Perceptual disturbances:   WNL  Orientation: oriented to person, place, and time/date  Attention: Good  Concentration: Good  Memory: WNL  Fund of knowledge:  Good  Insight:   Poor  Judgment:  Fair  Impulse Control: Poor   Risk Assessment: Danger to Self:  No Self-injurious Behavior: No Danger to Others: No Duty to Warn:no Physical Aggression / Violence:No  Access to Firearms a concern: No  Gang Involvement:No   Subjective: Beginning the session, patient stated that overall he is doing okay and that he will occasionally get angry with spouse. Patient provided a couple of examples and indicated that it has been hard to apply previously discussed tools in those situations. Elaborating, patient stated that he wants to understand the "root cause" of the anger he experiences. Patient stated understanding regarding the transition and voiced agreement that it would be better to explore that aspect with a clinician who he can have a longer relationship with than this clinician. He denied suicidal and homicidal ideation.    Interventions:  Worked on developing a therapeutic relationship with the patient using active listening and reflective statements. Provided emotional support using empathy and validation. Reviewed  the treatment plan with the patient. Reviewed events since the last session and praised the patient for doing a little bit better. Identified goals for the session. Explored the situations that have contributed to concern for the patient. Normalized and validated thoughts and emotions. Clarified what the patient was hoping to gain moving forward. Provided psychoeducation about transition. Used a Product/process development scientist to assist the patient gain better insight into why it may be better to discuss "root cause" of anger with another clinician. Processed clinician's concern related to leaving the patient vulnerable and not being able to accomplish goals. Discussed options for the patient. Processed thoughts and emotions. Provided empathic statements. Assessed for suicidal and homicidal ideation.   Homework: NA  Next Session: NA. Transition to another patient to better explore "root cause" of anger  Diagnosis: F41.1 generalized anxiety disorder  Plan:   Goals Reduce overall frequency, intensity, and duration of anxiety Stabilize anxiety level wile increasing ability to function Enhance ability to effectively cope with full variety of stressors Learn and implement coping skills that result in a reduction of anxiety   Objectives target date for all objectives is  01/19/2023 Verbalize an understanding of the cognitive, physiological, and behavioral components of anxiety Learning and implement calming skills to reduce overall anxiety Verbalize an understanding of the role that cognitive biases play in excessive irrational worry and persistent anxiety symptoms Identify, challenge, and replace based fearful talk Learn and implement problem solving strategies Identify and engage in pleasant activities Learning and implement personal and interpersonal skills to reduce anxiety and improve interpersonal relationships Learn to accept limitations in life and commit to tolerating, rather than avoiding, unpleasant emotions  while accomplishing meaningful goals Identify major life conflicts from the past and present that form the basis for present anxiety Maintain involvement in work, family, and social activities Reestablish a consistent sleep-wake cycle Cooperate with a medical evaluation  Interventions Engage the patient in behavioral activation Use instruction, modeling, and role-playing to build the client's general social, communication, and/or conflict resolution skills Use Acceptance and Commitment Therapy to help client accept uncomfortable realities in order to accomplish value-consistent goals Reinforce the client's insight into the role of his/her past emotional pain and present anxiety  Support the client in following through with work, family, and social activities Teach and implement sleep hygiene practices  Refer the patient to a physician for a psychotropic medication consultation Monito the clint's psychotropic medication compliance Discuss how anxiety typically involves excessive worry, various bodily expressions of tension, and avoidance of what is threatening that interact to maintain the problem  Teach the patient relaxation skills Assign the patient homework Discuss examples demonstrating that unrealistic worry overestimates the probability of threats and underestimates patient's ability  Assist the patient in analyzing his or her worries Help patient understand that avoidance is reinforcing   The patient and clinician reviewed the treatment plan on 02/20/2022. The patient approved of the treatment plan.   Conception Chancy, PsyD

## 2022-09-26 ENCOUNTER — Ambulatory Visit (INDEPENDENT_AMBULATORY_CARE_PROVIDER_SITE_OTHER): Payer: Medicare Other | Admitting: Behavioral Health

## 2022-09-26 DIAGNOSIS — R454 Irritability and anger: Secondary | ICD-10-CM | POA: Diagnosis not present

## 2022-09-26 DIAGNOSIS — F411 Generalized anxiety disorder: Secondary | ICD-10-CM | POA: Diagnosis not present

## 2022-09-26 NOTE — Progress Notes (Signed)
Shannon King Counselor Initial Adult Exam  Name: Shannon King Date: 09/26/2022 MRN: [id number] DOB: 09-24-1950 PCP: Shannon Massed, MD  Time spent: 56 minutes spent in person with the patient  Guardian/Payee: Self  Paperwork requested: No   Reason for Visit /Presenting Problem: Anxiety/irritability  "Shannon King" is a 72 year old married male who presents with symptoms of anxiety and irritability.  He had previously been seeing Shannon King in this clinic and said that breathing exercises as well as visualization have been very helpful in terms of how strongly he reacts and how often he reacts.  He and his wife also saw a marital therapist who provided some good resources for them and that has been beneficial.  He has been married 31 years and has no children.  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  He reports that he was getting angry easily and he did not necessarily understand why.  He said that his wife described him as irritable and grumpy.  He does report significant progress over the past 2 months in terms of that coming down and he has learned how to leave situations which she sees as possible irritants typically can breathe and practice coping skills on previously.  He now recognizes that he is not reacting well when he is being told what to do.  He says that has always been somewhat of a problem for him but that has gotten worse over the past couple of months and says he can "blow up."  He does not like to respond that way and it hurts his wife's feelings.  He also has hearing aids and at times does not realize how loudly he is speaking.  He cited an example from this past weekend where he said something to his wife and what he thought was a normal voice but she reacted strongly and he was yelling at her.  He recognized in the moment that he was not mindful of his responsibility to an argument which stated  resolved but he does not that to happen.  We used that example as a way to look at mindfulness and keeping his thoughts in the moment.  We will work on that more in future sessions.  The patient is semiretired.  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  He says he likes that he is staying physically active doing so.  It also helps with some extra money although he says that financially there is not any stress.  His house and car paid for.  He has a classic sports car, [address] which she enjoys working on and driving.  He belongs to a group at the club.  He and his wife are very active kayaking, biking, walking.  He says that he was blessed with a skill set so he can fix almost anything around the house.  He currently is on about 10 mg of Prozac generic through his primary care physician.  They did increase that to 25 but said that it decreased his sleeping and made him more hyperactive so they went back to 10 mg and he feels that is a good therapeutic dose.  He says it takes the edge off of the anxiety and stress.  When he went to his PCP about the issue he requested to see a therapist.  He reports that for the last 2 weeks sleep has  been pretty good but for the most part there are nights where he has a hard time shutting his brain off.  He says at times about things that have taken place at other times it is about things that he has to do and how he will fix them.  We talked about making a list as a way to get out of his head and keeping that close by so he can write it down and then check it off to give him a sense of accomplishment.  Also introduced progressive muscle relaxation which I encouraged him to practice between now and the next session as well as starting to introduce the concept of intentional mindfulness.  He does contract for safety having no thoughts of hurting himself or anyone else.  Goals are to reduce his  anxiety and irritability.  Mental Status Exam: Appearance:   Well Groomed     Behavior:  Appropriate  Motor:  Normal  Speech/Language:   Clear and Coherent  Affect:  Appropriate  Mood:  normal  Thought process:  normal  Thought content:    WNL  Sensory/Perceptual disturbances:    WNL  Orientation:  oriented to person, place, time/date, situation, day of week, and month of year  Attention:  Good  Concentration:  Good  Memory:  WNL  Fund of knowledge:   Good  Insight:    Good  Judgment:   Good  Impulse Control:  Good    Reported Symptoms: Anxiety/stress, irritability  Risk Assessment: Danger to Self:  No Self-injurious Behavior: No Danger to Others: No Duty to Warn:no Physical Aggression / Violence:No  Access to Firearms a concern: No  Gang Involvement:No  Patient / guardian was educated about steps to take if suicide or homicide risk level increases between visits: n/a While future psychiatric events cannot be accurately predicted, the patient does not currently require acute inpatient psychiatric care and does not currently meet [facility] involuntary commitment criteria.  Substance Abuse History: Current substance abuse: No     Past Psychiatric History:   Previous psychological history is significant for anxiety and irritability Outpatient Providers: Primary care physician History of Psych Hospitalization:  None reported Psychological Testing:  n/a    Abuse History:  Victim of: No.,  None reported    Report needed: No. Victim of Neglect:No. Perpetrator of none reported  Witness / Exposure to Domestic Violence: None reported  Protective Services Involvement: No  Witness to [employer] Violence:  No   Family History:  Family History  Problem Relation Age of Onset   Leukemia Mother    Diabetes Mother    Hearing loss Mother    Hearing loss Father    Stroke Father    Diabetes Father    Lung cancer Maternal Grandfather    Diabetes Paternal Grandfather      Living situation: the patient lives with their spouse  Sexual Orientation: Straight  Relationship Status: married  Name of spouse / other: Did not discuss If a parent, number of children / ages: No children  Support Systems: spouse  Surveyor, quantity Stress:  No   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  He also has a classic corvette which he drives and meets with a car  club.  Stressors: Other: Patient reports that he is just irritable and does not always understand why    Strengths: Supportive Relationships, Self Advocate, and Able to Communicate Effectively  Barriers:     Legal History: Pending legal issue / charges: The patient has no significant history of legal issues. 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 90 capsule 1   Omega-3 Fatty Acids (FISH OIL) 1000 MG CAPS Take by mouth daily.     psyllium (METAMUCIL) 58.6 % powder Take 1 packet by mouth as needed.      Saw Palmetto 450 MG CAPS Take by mouth 2 (two) times daily.     VITAMIN D PO Take 125 mcg by mouth daily.     No current facility-administered medications for this visit.    No Known Allergies  Diagnoses:  Generalized anxiety disorder, irritability  Plan of Care: I will meet with the patient once a month   [name], [facility]

## 2022-09-26 NOTE — Progress Notes (Signed)
                Shannon King M Tyrica Afzal, LCMHC 

## 2022-09-27 ENCOUNTER — Other Ambulatory Visit: Payer: Self-pay | Admitting: Family Medicine

## 2022-10-22 ENCOUNTER — Other Ambulatory Visit: Payer: Self-pay | Admitting: Family Medicine

## 2022-10-24 ENCOUNTER — Encounter: Payer: Self-pay | Admitting: Behavioral Health

## 2022-10-24 ENCOUNTER — Ambulatory Visit: Payer: Medicare Other | Admitting: Behavioral Health

## 2022-10-24 DIAGNOSIS — F411 Generalized anxiety disorder: Secondary | ICD-10-CM

## 2022-10-24 DIAGNOSIS — R454 Irritability and anger: Secondary | ICD-10-CM

## 2022-10-24 NOTE — Progress Notes (Signed)
                Jannis Atkins M Chrishon Martino, LCMHC 

## 2022-10-24 NOTE — Progress Notes (Addendum)
[facility] Counselor/Therapist Progress Note  Patient ID: Shannon King, MRN: [id number],    Date: 10/24/2022  Time Spent: 58 minutes  Treatment Type: Individual Therapy  Reported Symptoms: Anxiety, irritability  Mental Status Exam: Appearance:  Well Groomed     Behavior: Appropriate  Motor: Normal  Speech/Language:  Clear and Coherent  Affect: Appropriate  Mood: normal  Thought process: normal  Thought content:   WNL  Sensory/Perceptual disturbances:   WNL  Orientation: oriented to person, place, time/date, situation, day of week, month of year, and year  Attention: Good  Concentration: Good  Memory: WNL  Fund of knowledge:  Good  Insight:   Good  Judgment:  Good  Impulse Control: Good   Risk Assessment: Danger to Self:  No Self-injurious Behavior: No Danger to Others: No Duty to Warn:no Physical Aggression / Violence:No  Access to Firearms a concern: No  Gang Involvement:No   Subjective: The patient reports a decrease in irritable or angry reactions especially over the past month.  He cited 2 examples 1 of which was a disagreement with his wife in which there was an escalation but even that was with less intensity than historically he could have responded.  He is using more cognitive reframing and calming techniques to keep himself from getting irritable.  Many times it is about when someone is challenging the way he is doing things or telling him he is wrong.  He acknowledges that he has a wide skill set so most of the time he knows the right thing to do but is allowing himself to say that he can have an opinion or that he can learn from it.  We looked at some examples historically where he had gotten angry and he was allowing himself to let the world saturate him on what is going on it is negative in nature with review politics or etc.  He is doing good self talk and tell himself that he knows he cannot control any of that and we talked about how he can  control that he has little quarter of the world.  He did try progressive muscle relaxation with minimal success.  He tried writing things down to keep from ruminating on them and that did help some.  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 He does contract for safety having no thoughts of hurting himself or anyone else. Interventions: Cognitive Behavioral Therapy  Diagnosis: Generalized anxiety disorder, irritability  Plan: As the patient feels that he is making improvement and he asked that we meet every couple of months with the option of him calling and if he feels he needs to be seen sooner. Treatment plan: To use cognitive behavioral therapy, as ego supportive therapy and elements of dialectical behavior therapy to reduce anxiety as well as irritability by 50% over the 36-month and being April 28, 2023.  Goals will be to improve his ability to manage anxiety symptoms and stress especially as it relates to what is going on in the world.  We will look at additional causes for anxiety and explore ways to reduce it as well as work with him to reduce conflict contributing to anxiety.  Lastly we will help him manage thoughts and worrisome thinking contributing to feelings of anxiety.  This will be measured by a GAD-7 score and per patient report.  We will also teach coping skills for managing  anxiety and irritability, use cognitive behavioral therapy to identify and change anxiety provoking thoughts and behavior patterns as well as use DBT distress tolerance and mindfulness skills to help him learn anxiety management skills.  We will also for goals for irritability look at developing an awareness of irritable behaviors and what triggers them as well as healthier ways to learn to handle irritable and angry feelings in constructive ways that improve daily functioning.  We will use mindfulness skills to help him  see situations clearly without negative emotions, use cognitive behavioral therapy to help him identify negative consequences to expressing anger in healthy ways and use cognitive behavioral therapy to help him replace unhealthy thoughts and behaviors contributing to irritability.  We will also provide self soothing and coping skills to help him reduce irritability. [name], [facility]

## 2022-10-25 ENCOUNTER — Other Ambulatory Visit: Payer: Self-pay | Admitting: Family Medicine

## 2022-10-30 ENCOUNTER — Telehealth: Payer: Self-pay

## 2022-10-30 MED ORDER — FLUOXETINE HCL 10 MG PO CAPS: 10.0000 mg | ORAL_CAPSULE | Freq: Every day | ORAL | 0 refills | Status: DC

## 2022-10-30 NOTE — Telephone Encounter (Signed)
Patient called back and is scheduled for 6/20

## 2022-10-30 NOTE — Telephone Encounter (Signed)
Dr.McGowen noted in AVS from 03/09/22;  FOLLOW UP: Return in about 6 months (around 09/08/2022) for annual CPE (fasting).   LVM for pt to return call. He is currently overdue for appt, please assist patient with scheduling, thanks.

## 2022-10-30 NOTE — Telephone Encounter (Signed)
Patient refill request.  Patient stated he was not due to come back for another 6 months.  Please advise.  He is has enough meds for 10 days.  [facility]  FLUoxetine (PROZAC) 10 MG capsule

## 2022-10-30 NOTE — Addendum Note (Signed)
Addended by: Emi Holes D on: 10/30/2022 04:18 PM   Modules accepted: Orders

## 2022-10-30 NOTE — Telephone Encounter (Signed)
Partial refill given until scheduled appt

## 2022-11-06 ENCOUNTER — Other Ambulatory Visit: Payer: Self-pay | Admitting: Family Medicine

## 2022-11-15 NOTE — Patient Instructions (Signed)

## 2022-11-16 ENCOUNTER — Encounter: Payer: Self-pay | Admitting: Family Medicine

## 2022-11-16 ENCOUNTER — Ambulatory Visit (INDEPENDENT_AMBULATORY_CARE_PROVIDER_SITE_OTHER): Payer: Medicare Other | Admitting: Family Medicine

## 2022-11-16 VITALS — BP 117/73 | HR 65 | Ht 68.5 in | Wt 166.4 lb

## 2022-11-16 DIAGNOSIS — R454 Irritability and anger: Secondary | ICD-10-CM

## 2022-11-16 DIAGNOSIS — E7800 Pure hypercholesterolemia, unspecified: Secondary | ICD-10-CM

## 2022-11-16 DIAGNOSIS — Z125 Encounter for screening for malignant neoplasm of prostate: Secondary | ICD-10-CM | POA: Diagnosis not present

## 2022-11-16 DIAGNOSIS — F411 Generalized anxiety disorder: Secondary | ICD-10-CM

## 2022-11-16 DIAGNOSIS — Z0000 Encounter for general adult medical examination without abnormal findings: Secondary | ICD-10-CM | POA: Diagnosis not present

## 2022-11-16 MED ORDER — FLUOXETINE HCL 10 MG PO CAPS: 10.0000 mg | ORAL_CAPSULE | Freq: Every day | ORAL | 1 refills | Status: DC

## 2022-11-16 NOTE — Progress Notes (Signed)
Office Note 11/16/2022  CC:  Chief Complaint  Patient presents with   Annual Exam    HPI:  Patient is a 72 y.o. male who is here for annual health maintenance exam and follow-up difficulty controlling anger.  Shannon King is happy to say he is doing very good. 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 Gets along with others well, says his anger control problems are significantly improved since getting some counseling and being on fluoxetine 10 mg a day. He is continuing counseling every 2 months with Serafina Mitchell [facility].   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   Difficulty controlling anger    Hypercholesterolemia 03/2019   pt declined statin trial 03/2019, 04/2020, 05/2021    Past Surgical History:  Procedure Laterality Date   COLONOSCOPY  09/21/2014   adenomas->recall 3-5 yrs   INGUINAL HERNIA REPAIR  1999   bilat   SPIROMETRY  2011   Normal   TONSILLECTOMY AND ADENOIDECTOMY  1965    Family History  Problem Relation Age of Onset   Leukemia Mother    Diabetes Mother    Hearing loss Mother    Hearing loss Father    Stroke Father    Diabetes Father    Lung cancer Maternal Grandfather    Diabetes Paternal Grandfather     Social History   Socioeconomic History   Marital status: Married    Spouse name: Not on file   Number of children: Not on file   Years of education: Not on file   Highest education level: Not on file  Occupational History   Not on file  Tobacco Use   Smoking status: Former    Types: Cigarettes    Quit date: 04/02/2012    Years since quitting: 10.6   Smokeless tobacco: Never  Vaping Use   Vaping Use: Never used  Substance and Sexual Activity   Alcohol use: Yes    Alcohol/week: 15.0 standard drinks of alcohol    Types: 1 Glasses of wine, 14 Cans of beer per week   Drug use: Never   Sexual activity: Not on file   Other Topics Concern   Not on file  Social History Narrative   Married, no children.   Relocated to [place] from [place] 2019.     Educ: 2 yrs college   Occup: "[employer].  Lived in [place] area in 1990s->helped build [employer] on [address] and on [address].   Former smoker: 20 pack-yr hx, quit 2013.   Alcohol: 2 beers per day, rare liquor.      Social Determinants of Health   Financial Resource Strain: Not on file  Food Insecurity: Not on file  Transportation Needs: Not on file  Physical Activity: Not on file  Stress: Not on file  Social Connections: Not on file  Intimate Partner Violence: Not on file    Outpatient Medications Prior to Visit  Medication Sig Dispense Refill   Omega-3 Fatty Acids (FISH OIL) 1000 MG CAPS Take by mouth daily.     psyllium (METAMUCIL) 58.6 % powder Take 1 packet by mouth as needed.     Saw Palmetto 450 MG CAPS Take by mouth 2 (two) times daily.     VITAMIN D PO Take 125 mcg by mouth daily.     FLUoxetine (PROZAC) 10 MG capsule Take 1 capsule (10 mg total) by mouth  daily. MUST KEEP APPT FOR FURTHER REFILLS 14 capsule 0   No facility-administered medications prior to visit.    No Known Allergies  Review of Systems  Constitutional:  Negative for appetite change, chills, fatigue and fever.  HENT:  Negative for congestion, dental problem, ear pain and sore throat.   Eyes:  Negative for discharge, redness and visual disturbance.  Respiratory:  Negative for cough, chest tightness, shortness of breath and wheezing.   Cardiovascular:  Negative for chest pain, palpitations and leg swelling.  Gastrointestinal:  Negative for abdominal pain, blood in stool, diarrhea, nausea and vomiting.  Genitourinary:  Negative for difficulty urinating, dysuria, flank pain, frequency, hematuria and urgency.  Musculoskeletal:  Negative for arthralgias, back pain, joint swelling, myalgias and neck stiffness.  Skin:  Negative for pallor and rash.  Neurological:   Negative for dizziness, speech difficulty, weakness and headaches.  Hematological:  Negative for adenopathy. Does not bruise/bleed easily.  Psychiatric/Behavioral:  Negative for confusion and sleep disturbance. The patient is not nervous/anxious.     PE;    11/16/2022    1:32 PM 04/04/2022    7:59 AM 03/15/2022    7:57 AM  Vitals with BMI  Height 5' 8.5" 5\' 10"  5\' 10"   Weight 166 lbs 6 oz 171 lbs 13 oz 171 lbs 10 oz  BMI 24.93 24.65 24.62  Systolic 117 117 161  Diastolic 73 72 66  Pulse 65 69 65    Gen: Alert, well appearing.  Patient is oriented to person, place, time, and situation. AFFECT: pleasant, lucid thought and speech. ENT: Ears: EACs clear, normal epithelium.  TMs with good light reflex and landmarks bilaterally.  Eyes: no injection, icteris, swelling, or exudate.  EOMI, PERRLA. Nose: no drainage or turbinate edema/swelling.  No injection or focal lesion.  Mouth: lips without lesion/swelling.  Oral mucosa pink and moist.  Dentition intact and without obvious caries or gingival swelling.  Oropharynx without erythema, exudate, or swelling.  Neck: supple/nontender.  No LAD, mass, or TM.  Carotid pulses 2+ bilaterally, without bruits. CV: RRR, no m/r/g.   LUNGS: CTA bilat, nonlabored resps, good aeration in all lung fields. ABD: soft, NT, ND, BS normal.  No hepatospenomegaly or mass.  No bruits. EXT: no clubbing, cyanosis, or edema.  Musculoskeletal: no joint swelling, erythema, warmth, or tenderness.  ROM of all joints intact. Skin - no sores or suspicious lesions or rashes or color changes  Pertinent labs:  Lab Results  Component Value Date   TSH 1.02 02/10/2022   Lab Results  Component Value Date   WBC 5.9 02/10/2022   HGB 13.8 02/10/2022   HCT 41.0 02/10/2022   MCV 91.3 02/10/2022   PLT 265.0 02/10/2022   Lab Results  Component Value Date   CREATININE 0.79 02/10/2022   BUN 12 02/10/2022   NA 138 02/10/2022   K 4.1 02/10/2022   CL 104 02/10/2022   CO2 28  02/10/2022   Lab Results  Component Value Date   ALT 13 02/10/2022   AST 15 02/10/2022   ALKPHOS 33 (L) 02/10/2022   BILITOT 0.7 02/10/2022   Lab Results  Component Value Date   CHOL 190 06/02/2021   Lab Results  Component Value Date   HDL 41.80 06/02/2021   Lab Results  Component Value Date   LDLCALC 134 (H) 06/02/2021   Lab Results  Component Value Date   TRIG 72.0 06/02/2021   Lab Results  Component Value Date   CHOLHDL 5 06/02/2021   Lab  Results  Component Value Date   PSA 0.81 06/02/2021   PSA 0.77 05/18/2020   Lab Results  Component Value Date   HGBA1C 5.6 02/10/2022   ASSESSMENT AND PLAN:   #1 health maintenance exam: Reviewed age and gender appropriate health maintenance issues (prudent diet, regular exercise, health risks of tobacco and excessive alcohol, use of seatbelts, fire alarms in home, use of sunscreen).  Also reviewed age and gender appropriate health screening as well as vaccine recommendations. Vaccines: Tdap->declined. Says he had one in [place] about 6 yrs ago.  Shingrix->declines #2 b/c #1 caused dry/uncomfortable skin changes. Labs:  psa today.  He declines any further labs today. Prostate ca screening: PSA today. Colon ca screening: adenomas 2016, due for recall->pt declines any further scopes or other colon ca screening.  #2 difficulty controlling anger.  Generalized anxiety. Improved on fluoxetine 10 mg a day.  Continue with this as well as counseling.  An After Visit Summary was printed and given to the patient.  FOLLOW UP:  Return in about 6 months (around 05/18/2023) for routine chronic illness f/u.  Signed:  Santiago Bumpers, MD           11/16/2022

## 2022-11-17 LAB — PSA, MEDICARE: PSA: 0.75 ng/ml (ref 0.10–4.00)

## 2022-11-20 NOTE — Progress Notes (Signed)
Pt given results  

## 2022-12-25 ENCOUNTER — Ambulatory Visit: Payer: Medicare Other | Admitting: Behavioral Health

## 2022-12-25 ENCOUNTER — Encounter: Payer: Self-pay | Admitting: Behavioral Health

## 2022-12-25 DIAGNOSIS — F411 Generalized anxiety disorder: Secondary | ICD-10-CM

## 2022-12-25 DIAGNOSIS — R454 Irritability and anger: Secondary | ICD-10-CM | POA: Diagnosis not present

## 2022-12-25 NOTE — Progress Notes (Signed)
[facility] Counselor/Therapist Progress Note  Patient ID: Shannon King, MRN: [id number],    Date: 12/25/2022  Time Spent: 58, 4:00 PM to 4:58 PM minutes spent face-to-face with the patient.  Treatment Type: Individual Therapy  Reported Symptoms: Anxiety, irritability  Mental Status Exam: Appearance:  Well Groomed     Behavior: Appropriate  Motor: Normal  Speech/Language:  Clear and Coherent  Affect: Appropriate  Mood: normal  Thought process: normal  Thought content:   WNL  Sensory/Perceptual disturbances:   WNL  Orientation: oriented to person, place, time/date, situation, day of week, month of year, and year  Attention: Good  Concentration: Good  Memory: WNL  Fund of knowledge:  Good  Insight:   Good  Judgment:  Good  Impulse Control: Good   Risk Assessment: Danger to Self:  No Self-injurious Behavior: No Danger to Others: No Duty to Warn:no Physical Aggression / Violence:No  Access to Firearms a concern: No  Gang Involvement:No   Subjective: The patient said that he stopped taking Prozac about 1-1/2 weeks ago because he did not feel that he was getting any more benefit from it.  He was concerned about the sexual side effects as well as long-term use of a psychotropic med.  He says he has been off about 1-1/2 to 2 weeks and has not noticed a significant difference in mood.  He gets intermittent a couple of situations where he got irritable handle things in a poor way that he is aware of that he feels that he is working on that.  I did tell him that changes he needed to reach out to his medical doctor he said he would go back on medication if he saw any increase in negative mood.  We did talk about the couple of situations her brother confirms a very angry.  We looked at what made him angry what made him feel like.  Often times and feels that if questions intermittently more.  He cited examples from a year ago even 6 months ago where he would handle things  in a much more negative way and is proud of the work that he is putting in.  We looked closely things to get in the way of good communication and how they affect the patient as well as how he is handling it and reiterated the fact that he needs to use that anxiety/stress reduction skills.  Also talked about especially in the light of an election year minimizing the amount of exposure he has to the political arena.  He does contract for safety having no thoughts of hurting himself or anyone else. Interventions: Cognitive Behavioral Therapy  Diagnosis: Generalized anxiety disorder, irritability  Plan: As the patient feels that he is making improvement and he asked that we meet every couple of months with the option of him calling and if he feels he needs to be seen sooner. Treatment plan: To use cognitive behavioral therapy, as ego supportive therapy and elements of dialectical behavior therapy to reduce anxiety as well as irritability by 50% over the 14-month and being April 28, 2023.  Goals will be to improve his ability to manage anxiety symptoms and stress especially as it relates to what is going on in the world.  We will look at additional causes for anxiety and explore ways to reduce it as well as work with him to reduce conflict contributing to anxiety.  Lastly we will help him manage thoughts and worrisome thinking contributing to feelings of anxiety.  This will be measured by a GAD-7 score and per patient report.  We will also teach coping skills for managing anxiety and irritability, use cognitive behavioral therapy to identify and change anxiety provoking thoughts and behavior patterns as well as use DBT distress tolerance and mindfulness skills to help him learn anxiety management skills.  We will also for goals for irritability look at developing an awareness of irritable behaviors and what triggers them as well as healthier ways to learn to handle irritable and angry feelings in constructive  ways that improve daily functioning.  We will use mindfulness skills to help him see situations clearly without negative emotions, use cognitive behavioral therapy to help him identify negative consequences to expressing anger in healthy ways and use cognitive behavioral therapy to help him replace unhealthy thoughts and behaviors contributing to irritability.  We will also provide self soothing and coping skills to help him reduce irritability. [name], [facility]                  [name], [facility]

## 2023-02-11 ENCOUNTER — Emergency Department (HOSPITAL_BASED_OUTPATIENT_CLINIC_OR_DEPARTMENT_OTHER)
Admission: EM | Admit: 2023-02-11 | Discharge: 2023-02-11 | Disposition: A | Discharge status: Home / Self Care | Payer: Medicare Other | Attending: Emergency Medicine | Admitting: Emergency Medicine

## 2023-02-11 ENCOUNTER — Emergency Department (HOSPITAL_BASED_OUTPATIENT_CLINIC_OR_DEPARTMENT_OTHER): Payer: Medicare Other

## 2023-02-11 ENCOUNTER — Other Ambulatory Visit: Payer: Self-pay

## 2023-02-11 DIAGNOSIS — K118 Other diseases of salivary glands: Secondary | ICD-10-CM | POA: Diagnosis not present

## 2023-02-11 DIAGNOSIS — K111 Hypertrophy of salivary gland: Secondary | ICD-10-CM | POA: Diagnosis not present

## 2023-02-11 DIAGNOSIS — R220 Localized swelling, mass and lump, head: Secondary | ICD-10-CM | POA: Diagnosis not present

## 2023-02-11 LAB — CBC WITH DIFFERENTIAL/PLATELET
Abs Immature Granulocytes: 0.03 10*3/uL (ref 0.00–0.07)
Basophils Absolute: 0.1 10*3/uL (ref 0.0–0.1)
Basophils Relative: 1 %
Eosinophils Absolute: 0.3 10*3/uL (ref 0.0–0.5)
Eosinophils Relative: 3 %
HCT: 39.8 % (ref 39.0–52.0)
Hemoglobin: 13.9 g/dL (ref 13.0–17.0)
Immature Granulocytes: 0 %
Lymphocytes Relative: 29 %
Lymphs Abs: 2.9 10*3/uL (ref 0.7–4.0)
MCH: 31.4 pg (ref 26.0–34.0)
MCHC: 34.9 g/dL (ref 30.0–36.0)
MCV: 90 fL (ref 80.0–100.0)
Monocytes Absolute: 0.9 10*3/uL (ref 0.1–1.0)
Monocytes Relative: 9 %
Neutro Abs: 5.9 10*3/uL (ref 1.7–7.7)
Neutrophils Relative %: 58 %
Platelets: 217 10*3/uL (ref 150–400)
RBC: 4.42 MIL/uL (ref 4.22–5.81)
RDW: 12.7 % (ref 11.5–15.5)
WBC: 10.1 10*3/uL (ref 4.0–10.5)
nRBC: 0 % (ref 0.0–0.2)

## 2023-02-11 LAB — BASIC METABOLIC PANEL
Anion gap: 6 (ref 5–15)
BUN: 15 mg/dL (ref 8–23)
CO2: 28 mmol/L (ref 22–32)
Calcium: 8.7 mg/dL — ABNORMAL LOW (ref 8.9–10.3)
Chloride: 105 mmol/L (ref 98–111)
Creatinine, Ser: 0.74 mg/dL (ref 0.61–1.24)
GFR, Estimated: >60 mL/min (ref >60)
Glucose, Bld: 106 mg/dL — ABNORMAL HIGH (ref 70–99)
Potassium: 3.9 mmol/L (ref 3.5–5.1)
Sodium: 139 mmol/L (ref 135–145)

## 2023-02-11 NOTE — ED Triage Notes (Signed)
Pt POV from home reporting L jaw swelling while eating lunch. Per pt swelling has improved significantly upon arrival. NAD noted in triage

## 2023-02-11 NOTE — Discharge Instructions (Addendum)
Please follow-up with ENT specialist have attached your for you today.  Today your labs and imaging are reassuring however you may have had a salivary duct stone causing your symptoms.  You did not show any signs of infection and your labs do not show this either and so antibiotics are not indicated at this time.  Please remain hydrated and suck on sour candies to help with the inflammation in your parotid gland.  If symptoms change or worsen please return to ER.

## 2023-02-11 NOTE — ED Notes (Signed)
ED Provider at bedside. 

## 2023-02-11 NOTE — ED Provider Notes (Signed)
[place] EMERGENCY DEPARTMENT AT [facility] Provider Note   CSN: [id number] Arrival date & time: 02/11/23  1457     History  No chief complaint on file.   DRE FORGACS is a 72 y.o. male history of generalized anxiety disorder, difficulty controlling anger, BPH presenting for swelling of the left side of his face.  Patient stated that he was eating lunch at 2:30 PM and noticed that the left side of his jaw was swelling.  Patient denies any hearing changes or ear pain or any recent dental infections and states he has been able to swallow his saliva and eat without issue.  Patient states since then the swelling of the left side has been going down.  Patient denies any fevers or recent illnesses.  Patient does not history of salivary stones.    Home Medications Prior to Admission medications   Medication Sig Start Date End Date Taking? Authorizing Provider  FLUoxetine (PROZAC) 10 MG capsule Take 1 capsule (10 mg total) by mouth daily. 11/16/22   McGowen, Maryjean Morn, MD  Omega-3 Fatty Acids (FISH OIL) 1000 MG CAPS Take by mouth daily.    [provider]  psyllium (METAMUCIL) 58.6 % powder Take 1 packet by mouth as needed.    [provider]  Saw Palmetto 450 MG CAPS Take by mouth 2 (two) times daily.    [provider]  VITAMIN D PO Take 125 mcg by mouth daily.    [provider]      Allergies    Patient has no known allergies.    Review of Systems   Review of Systems  Physical Exam Updated Vital Signs BP 129/77 (BP Location: Right Arm)   Pulse 80   Temp 98.6 F (37 C) (Oral)   Resp 18   SpO2 98%  Physical Exam Constitutional:      General: He is not in acute distress. HENT:     Head:     Comments: Left parotid gland swelling has nontender to palpation without skin color changes    Left Ear: Tympanic membrane, ear canal and external ear normal.     Ears:     Comments: No mastoid tenderness or erythema noted    Nose: Nose  normal.     Mouth/Throat:     Mouth: Mucous membranes are moist.     Pharynx: No posterior oropharyngeal erythema.     Comments: No salivary stone was palpated in the duct No intraoral abnormalities were noted Tolerating secretions Cardiovascular:     Rate and Rhythm: Normal rate and regular rhythm.     Pulses: Normal pulses.     Heart sounds: Normal heart sounds.  Pulmonary:     Effort: Pulmonary effort is normal. No respiratory distress.     Breath sounds: Normal breath sounds.  Skin:    General: Skin is warm and dry.  Neurological:     Mental Status: He is alert.     ED Results / Procedures / Treatments   Labs (all labs ordered are listed, but only abnormal results are displayed) Labs Reviewed  BASIC METABOLIC PANEL - Abnormal; Notable for the following components:      Result Value   Glucose, Bld 106 (*)    Calcium 8.7 (*)    All other components within normal limits  CBC WITH DIFFERENTIAL/PLATELET    EKG None  Radiology CT Maxillofacial WO CM  Result Date: 02/11/2023 CLINICAL DATA:  Left jaw swelling while eating lunch. Swelling has  subsequently improved. Assess for salivary gland stone. EXAM: CT MAXILLOFACIAL WITHOUT CONTRAST TECHNIQUE: Multidetector CT imaging of the maxillofacial structures was performed. Multiplanar CT image reconstructions were also generated. RADIATION DOSE REDUCTION: This exam was performed according to the departmental dose-optimization program which includes automated exposure control, adjustment of the mA and/or kV according to patient size and/or use of iterative reconstruction technique. COMPARISON:  None Available. FINDINGS: Osseous: No fracture or bone lesion. Normally aligned temporomandibular joints. Orbits: Normal. Sinuses: Clear. Soft tissues: Normal CT appearance of the parotid and submandibular glands. No salivary gland or salivary gland duct stone. No adjacent inflammation. Limited intracranial: No significant or unexpected finding.  IMPRESSION: 1. No acute findings. No salivary gland inflammation or evidence of a gland or duct stone. Electronically Signed   By: Amie Portland M.D.   On: 02/11/2023 16:21    Procedures Procedures    Medications Ordered in ED Medications - No data to display  ED Course/ Medical Decision Making/ A&P                                 Medical Decision Making  CHARL MENGESHA 72 y.o. presented today for left-sided jaw swelling. Working DDx that I considered at this time includes, but not limited to, sialolithiasis, sialoadenitis, parotiditis, AOM, mastoiditis, trauma fracture, TMJ, dental infection.  R/o DDx: sialoadenitis, parotiditis, AOM, mastoiditis, trauma fracture, TMJ, dental infection: These are considered less likely due to history of present illness, physical exam, labs/imaging findings  Review of prior external notes: 04/04/2022 office visit  Unique Tests and My Interpretation:  CBC: Markable BMP: Unremarkable CT maxillofacial without contrast: Unremarkable  Discussion with Independent Historian:  Wife  Discussion of Management of Tests: None  Risk: Low: based on diagnostic testing/clinical impression and treatment plan  Risk Stratification Score: None  Plan: On exam patient was in no acute distress with stable vitals.  Patient did have swelling on the left side of his face namely over his left parotid gland however his left parotid gland was nontender to palpation and did not notice any other signs of infection.  I did not note any salivary duct stone either on exam patient was tolerating secretions.  Initial CT does not show any stone however patient states that his swelling has gone down since lunch so he may have passed the stone.  I spoke with the patient and we had shared decision making in which we agreed to proceed with labs to make sure there was any signs of infection or indication for antibiotics but will most likely discharge with ENT follow-up and encourage  fluids along with sour candies.  Patient's labs are reassuring and will discharge with ENT follow-up.  Patient is endorsing infectious symptoms and does not have a white count so antibiotics are not indicated at this time.  Patient was given return precautions. Patient stable for discharge at this time.  Patient verbalized understanding of plan.         Final Clinical Impression(s) / ED Diagnoses Final diagnoses:  Parotid gland enlargement    Rx / DC Orders ED Discharge Orders     None         Remi Deter 02/11/23 2043    Gwyneth Sprout, MD 02/15/23 0023

## 2023-02-15 ENCOUNTER — Encounter (INDEPENDENT_AMBULATORY_CARE_PROVIDER_SITE_OTHER): Payer: Self-pay | Admitting: Otolaryngology

## 2023-02-15 ENCOUNTER — Ambulatory Visit (INDEPENDENT_AMBULATORY_CARE_PROVIDER_SITE_OTHER): Payer: Medicare Other | Admitting: Otolaryngology

## 2023-02-15 VITALS — BP 136/80 | HR 64 | Ht 71.0 in | Wt 174.4 lb

## 2023-02-15 DIAGNOSIS — Z87891 Personal history of nicotine dependence: Secondary | ICD-10-CM | POA: Diagnosis not present

## 2023-02-15 DIAGNOSIS — K219 Gastro-esophageal reflux disease without esophagitis: Secondary | ICD-10-CM | POA: Diagnosis not present

## 2023-02-15 DIAGNOSIS — R0981 Nasal congestion: Secondary | ICD-10-CM | POA: Diagnosis not present

## 2023-02-15 DIAGNOSIS — J3089 Other allergic rhinitis: Secondary | ICD-10-CM | POA: Diagnosis not present

## 2023-02-15 DIAGNOSIS — R1310 Dysphagia, unspecified: Secondary | ICD-10-CM | POA: Diagnosis not present

## 2023-02-15 DIAGNOSIS — R0982 Postnasal drip: Secondary | ICD-10-CM | POA: Diagnosis not present

## 2023-02-15 DIAGNOSIS — R09A2 Foreign body sensation, throat: Secondary | ICD-10-CM

## 2023-02-15 DIAGNOSIS — H9193 Unspecified hearing loss, bilateral: Secondary | ICD-10-CM | POA: Diagnosis not present

## 2023-02-15 MED ORDER — FLUTICASONE PROPIONATE 50 MCG/ACT NA SUSP: 2.0000 | Freq: Every day | NASAL | 6 refills | Status: DC

## 2023-02-15 MED ORDER — DESLORATADINE 5 MG PO TABS: 5.0000 mg | ORAL_TABLET | Freq: Every day | ORAL | 3 refills | Status: DC

## 2023-02-15 MED ORDER — SALINE SPRAY 0.65 % NA SOLN: 1.0000 | NASAL | 5 refills | Status: DC | PRN

## 2023-02-15 NOTE — Progress Notes (Signed)
ENT CONSULT:  Reason for Consult: left parotid swelling    HPI: Shannon King is an 72 y.o. male former smoker who is here for initial evaluation with me 2/2 Episode of facial swelling  He was eating lunch and left parotid tail area was swollen. He then went to ED and was told to use lemon cough drops. Within a day or so it was back to normal. No hx of prior episodes like that. No hx of RAI or XRT to the neck.  Quit smoking 12 yrs ago, used to take OTC reflux medications.  2. Dysphagia to meat and some solid foods, feels that it does not go down from time to time. No choking on liquids. Hx of heartburn in the past not on any medications 3. SNHL using hearing aids currently. No recent Audiogram.  4. Sensation of a tickle in his throat and cough   Records Reviewed:  ED note from 02/11/2023 Shannon King is a 72 y.o. male history of generalized anxiety disorder, difficulty controlling anger, BPH presenting for swelling of the left side of his face.  Patient stated that he was eating lunch at 2:30 PM and noticed that the left side of his jaw was swelling.  Patient denies any hearing changes or ear pain or any recent dental infections and states he has been able to swallow his saliva and eat without issue.  Patient states since then the swelling of the left side has been going down.  Patient denies any fevers or recent illnesses.  Patient does not history of salivary stones.   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   Difficulty controlling anger    Hypercholesterolemia 03/2019   pt declined statin trial 03/2019, 04/2020, 05/2021    Past Surgical History:  Procedure Laterality Date   COLONOSCOPY  09/21/2014   adenomas->recall 3-5 yrs   INGUINAL HERNIA REPAIR  1999   bilat   SPIROMETRY  2011   Normal   TONSILLECTOMY AND ADENOIDECTOMY  1965    Family  History  Problem Relation Age of Onset   Leukemia Mother    Diabetes Mother    Hearing loss Mother    Hearing loss Father    Stroke Father    Diabetes Father    Lung cancer Maternal Grandfather    Diabetes Paternal Grandfather     Social History:  reports that he quit smoking about 10 years ago. His smoking use included cigarettes. He has never used smokeless tobacco. He reports current alcohol use of about 15.0 standard drinks of alcohol per week. He reports that he does not use drugs.  Allergies: No Known Allergies  Medications: I have reviewed the patient's current medications.  The PMH, PSH, Medications, Allergies, and SH were reviewed and updated.  ROS: Constitutional: Negative for fever, weight loss and weight gain. Cardiovascular: Negative for chest pain and dyspnea on exertion. Respiratory: Is not experiencing shortness of breath at rest. Gastrointestinal: Negative for nausea and vomiting. Neurological: Negative for headaches. Psychiatric: The patient is not nervous/anxious  Blood pressure 136/80, pulse 64, height 5\' 11"  (1.803 m), weight 174 lb 6.4 oz (79.1 kg), SpO2 100%.  PHYSICAL EXAM:  Exam: General: Well-developed, well-nourished Communication and Voice: raspy Respiratory Respiratory effort: Equal inspiration and expiration without stridor Cardiovascular Peripheral Vascular: Warm extremities with equal color/perfusion Eyes: No nystagmus with equal extraocular  motion bilaterally Neuro/Psych/Balance: Patient oriented to person, place, and time; Appropriate mood and affect; Gait is intact with no imbalance; Cranial nerves I-XII are intact Head and Face Inspection: Normocephalic and atraumatic without mass or lesion Palpation: Facial skeleton intact without bony stepoffs Salivary Glands: No mass or tenderness Facial Strength: Facial motility symmetric and full bilaterally ENT Pinna: External ear intact and fully developed External canal: Canal is patent with  intact skin Tympanic Membrane: Clear and mobile External Nose: No scar or anatomic deformity Internal Nose: Septum is deviated to the left. No polyp, or purulence. Mucosal edema and erythema present.  Bilateral inferior turbinate hypertrophy.  Lips, Teeth, and gums: Mucosa and teeth intact and viable TMJ: No pain to palpation with full mobility Oral cavity/oropharynx: No erythema or exudate, no lesions present Nasopharynx: No mass or lesion with intact mucosa Hypopharynx: Intact mucosa without pooling of secretions Larynx Glottic: Full true vocal cord mobility without lesion or mass Supraglottic: Normal appearing epiglottis and AE folds Interarytenoid Space: Moderate pachydermia edema Subglottic Space: Patent without lesion or edema Neck Neck and Trachea: Midline trachea without mass or lesion Thyroid: No mass or nodularity Lymphatics: No lymphadenopathy  Procedure: Preoperative diagnosis:  cough and globus sensation  Postoperative diagnosis:   Same + GERD/LPR  Procedure: Flexible fiberoptic laryngoscopy  Surgeon: Ashok Croon, MD  Anesthesia: Topical lidocaine and Afrin Complications: None Condition is stable throughout exam  Indications and consent:  The patient presents to the clinic with Indirect laryngoscopy view was incomplete. Thus it was recommended that they undergo a flexible fiberoptic laryngoscopy. All of the risks, benefits, and potential complications were reviewed with the patient preoperatively and verbal informed consent was obtained.  Procedure: The patient was seated upright in the clinic. Topical lidocaine and Afrin were applied to the nasal cavity. After adequate anesthesia had occurred, I then proceeded to pass the flexible telescope into the nasal cavity. The nasal cavity was patent without rhinorrhea or polyp. The nasopharynx was also patent without mass or lesion. The base of tongue was visualized and was normal. There were no signs of pooling of  secretions in the piriform sinuses. The true vocal folds were mobile bilaterally. There were no signs of glottic or supraglottic mucosal lesion or mass. There was moderate interarytenoid pachydermia and post cricoid edema. The telescope was then slowly withdrawn and the patient tolerated the procedure throughout.   Studies Reviewed: CT max/face 02/11/23  FINDINGS: Osseous: No fracture or bone lesion. Normally aligned temporomandibular joints.   Orbits: Normal.   Sinuses: Clear.   Soft tissues: Normal CT appearance of the parotid and submandibular glands. No salivary gland or salivary gland duct stone. No adjacent inflammation.   Limited intracranial: No significant or unexpected finding.   IMPRESSION: 1. No acute findings. No salivary gland inflammation or evidence of a gland or duct stone.  Assessment/Plan: Encounter Diagnoses  Name Primary?   Decreased hearing of both ears Yes   Gastroesophageal reflux disease without esophagitis    Globus sensation    Post-nasal drainage    Nasal congestion    Environmental and seasonal allergies    History of smoking [Z87.891]     Episode of facial swelling - resolved completely - was in the area of parotid tail on the left, he increased hydration. No palpable or obvious swelling on exam, no masses, and negative CT neck in ED. Likely sialoadenitis which resolved (could have passed a small stone.  -I discussed supportive care to avoid sialadenitis in the future including hydration and sialagogues  2. 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 No choking on liquids. Hx of heartburn in the past not on any medications.  No prior swallow studies.  Able to tolerate regular diet.  No aspiration pneumonia.  No weight loss. Ddx oropharyngeal vs esophageal dysphagia  - MBS esophagram to better evaluate I like it 3. GERD LPR -evidence of it on flexible laryngoscopy exam  -Diet and  lifestyle changes to minimize reflux -Trial of reflux Gourmet 4. Environmental allergies and post-nasal drainage -evidence of thick clear mucus along the supraglottis and postnasal drainage as well as mucosal edema nasal passage and nasal septal deviation on the left with inferior turban hypertrophy -Trial of Flonase and Clarinex   3. Long-standing SNHL using hearing aids currently. No recent Audiogram.  - Audiogram and RTC after testing  4. Sensation of a tickle in his throat and cough  Likely due to untreated GERD LPR and postnasal drainage.  Will initiate management as above.  He will return after testing  Thank you for allowing me to participate in the care of this patient. Please do not hesitate to contact me with any questions or concerns.   Ashok Croon, MD Otolaryngology [facility] Phone: [phone] Fax: [phone]    02/15/2023, 9:11 AM

## 2023-02-15 NOTE — Patient Instructions (Addendum)
-   start Flonase and Clarinex for nasal congestion and post-nasal drainage - try reflux gourmet and diet lifestyle changes to minimize reflux  - schedule swallow study - schedule Audiogram  -return after testing  - Take Reflux Gourmet (natural supplement available on [employer]) to help with symptoms of chronic throat irritation

## 2023-02-26 ENCOUNTER — Ambulatory Visit: Payer: Medicare Other | Admitting: Behavioral Health

## 2023-02-26 ENCOUNTER — Encounter: Payer: Self-pay | Admitting: Behavioral Health

## 2023-02-26 DIAGNOSIS — F411 Generalized anxiety disorder: Secondary | ICD-10-CM

## 2023-02-26 DIAGNOSIS — R454 Irritability and anger: Secondary | ICD-10-CM

## 2023-02-26 NOTE — Progress Notes (Signed)
[facility] Counselor/Therapist Progress Note  Patient ID: DARCELL SABINO, MRN: [id number],    Date: 02/26/2023  Time Spent: 51 minutes, 8:09 AM until 9 AM spent face-to-face with the patient.  Treatment Type: Individual Therapy  Reported Symptoms: Anxiety, irritability  Mental Status Exam: Appearance:  Well Groomed     Behavior: Appropriate  Motor: Normal  Speech/Language:  Clear and Coherent  Affect: Appropriate  Mood: normal  Thought process: normal  Thought content:   WNL  Sensory/Perceptual disturbances:   WNL  Orientation: oriented to person, place, time/date, situation, day of week, month of year, and year  Attention: Good  Concentration: Good  Memory: WNL  Fund of knowledge:  Good  Insight:   Good  Judgment:  Good  Impulse Control: Good   Risk Assessment: Danger to Self:  No Self-injurious Behavior: No Danger to Others: No Duty to Warn:no Physical Aggression / Violence:No  Access to Firearms a concern: No  Gang Involvement:No   Subjective: The patient reports that for the most part he feels he is making significant progress in anxiety reduction and managing his irritability.  There are times he still gets irritable but for the most part he keeps it in check and is very mindful of his response.  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  He was not angry at his wife but it led to an argument with her because she wanted to let the company doing when he felt they were not doing it like they had hired him to do.  He said he is certain his best not to discuss it in the heat of the moment but did not do a great job of it in that case but has done a much better job in general.  He is more intentional about walking away when he is angry and I encouraged him to have that communication with his wife letting her know what he is doing with the intention of coming back  to have the conversation.  He said they are doing a much better job of coming back together talking through it which is something he was not doing previously.  He is practicing being more mindful of those things which do drive his irritability and anger and is more intentional about stepping away before he says something he regrets.  He understands that to work and process but he considers the job that he is doing to be much more productive than what he was previously.  He would like to continue to meet every couple of months as a way to check in and knows that he can call in if he needs to come in sooner.  He does contract for safety having no thoughts of hurting himself or anyone else. Interventions: Cognitive Behavioral Therapy  Diagnosis: Generalized anxiety disorder, irritability  Plan: As the patient feels that he is making improvement and he asked that we meet every couple of months with the option of him calling and if he feels he needs to be seen sooner. Treatment plan: To use cognitive behavioral therapy, as ego supportive therapy and elements of dialectical behavior therapy to reduce anxiety as well as irritability by 50% over the 61-month and being April 28, 2023.  Goals will be to improve his ability to manage anxiety symptoms and stress especially as it relates to what is going on in the  world.  We will look at additional causes for anxiety and explore ways to reduce it as well as work with him to reduce conflict contributing to anxiety.  Lastly we will help him manage thoughts and worrisome thinking contributing to feelings of anxiety.  This will be measured by a GAD-7 score and per patient report.  We will also teach coping skills for managing anxiety and irritability, use cognitive behavioral therapy to identify and change anxiety provoking thoughts and behavior patterns as well as use DBT distress tolerance and mindfulness skills to help him learn anxiety management skills.  We will  also for goals for irritability look at developing an awareness of irritable behaviors and what triggers them as well as healthier ways to learn to handle irritable and angry feelings in constructive ways that improve daily functioning.  We will use mindfulness skills to help him see situations clearly without negative emotions, use cognitive behavioral therapy to help him identify negative consequences to expressing anger in healthy ways and use cognitive behavioral therapy to help him replace unhealthy thoughts and behaviors contributing to irritability.  We will also provide self soothing and coping skills to help him reduce irritability.  Progress, 40%.  We will review the treatment plan and the next visit on December 2. [name], [facility]                  [name], [facility]               [name], [facility]

## 2023-02-28 DIAGNOSIS — H35363 Drusen (degenerative) of macula, bilateral: Secondary | ICD-10-CM | POA: Diagnosis not present

## 2023-02-28 DIAGNOSIS — H25813 Combined forms of age-related cataract, bilateral: Secondary | ICD-10-CM | POA: Diagnosis not present

## 2023-03-14 ENCOUNTER — Telehealth: Payer: Self-pay

## 2023-03-14 NOTE — Telephone Encounter (Signed)
Transition Care Management Follow-up Telephone Call Date of discharge and from where: [facility] 9/15 How have you been since you were released from the hospital? Doing fine and has not followed up with pcp as he has no concerns at this time  Any questions or concerns? No  Items Reviewed: Did the pt receive and understand the discharge instructions provided? Yes  Medications obtained and verified? No  Other? No  Any new allergies since your discharge? No  Dietary orders reviewed? No Do you have support at home? No    Follow up appointments reviewed:  PCP Hospital f/u appt confirmed? No  Scheduled to see  on  @ . Specialist Hospital f/u appt confirmed? No  Scheduled to see  on  @ . Are transportation arrangements needed? No  If their condition worsens, is the pt aware to call PCP or go to the Emergency Dept.? Yes Was the patient provided with contact information for the PCP's office or ED? Yes Was to pt encouraged to call back with questions or concerns? Yes

## 2023-04-17 ENCOUNTER — Telehealth (HOSPITAL_COMMUNITY): Payer: Self-pay | Admitting: Otolaryngology

## 2023-04-17 NOTE — Telephone Encounter (Addendum)
Pt declined Dr. Leighton Roach order for OP MBS/ESO. Deferred from September per pt request and followed back up with him today- final decision to not move forward with the tests unless his PCP suggested it needed to be done. AHARRIS

## 2023-04-30 ENCOUNTER — Ambulatory Visit: Payer: Medicare Other | Admitting: Behavioral Health

## 2023-04-30 ENCOUNTER — Telehealth: Payer: Self-pay | Admitting: Behavioral Health

## 2023-04-30 NOTE — Telephone Encounter (Signed)
Retrieved a voicemail patient left on [facility] mailbox 04/28/2023@1104  cancelling "indefinitely" his 04/30/2023@0800  appointment with Serafina Mitchell with [facility] stating he wanted to call within the 24 hour window to cancel.

## 2023-05-18 ENCOUNTER — Encounter: Payer: Self-pay | Admitting: Family Medicine

## 2023-05-18 ENCOUNTER — Ambulatory Visit: Payer: Medicare Other | Admitting: Family Medicine

## 2023-05-18 VITALS — BP 101/69 | HR 95 | Wt 175.0 lb

## 2023-05-18 DIAGNOSIS — M7502 Adhesive capsulitis of left shoulder: Secondary | ICD-10-CM | POA: Diagnosis not present

## 2023-05-18 DIAGNOSIS — Z23 Encounter for immunization: Secondary | ICD-10-CM | POA: Diagnosis not present

## 2023-05-18 DIAGNOSIS — R454 Irritability and anger: Secondary | ICD-10-CM | POA: Diagnosis not present

## 2023-05-18 NOTE — Progress Notes (Signed)
OFFICE VISIT  05/18/2023  CC:  Chief Complaint  Patient presents with   Mood    6 mo F/u.     Patient is a 72 y.o. male who presents for 6 mo f/u difficulty controlling anger. He was doing much better on fluoxetine 10mg  daily at last visit.  INTERIM HX: He feels like he is doing well.  He took himself off the fluoxetine because he felt like it was affecting his sleep.  He is not confident that it was actually doing any good, either. He feels fine and says his relationships with his wife and coworkers are fine. 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  Has concern of chronic left shoulder decreased range of motion.  He says 14 years ago he slipped on ice and fell and caught himself with his left arm.  He went to the doctor and x-ray was done and he was told no fracture.  He had some significant pain and impaired range of motion for about 6 months.  The pain gradually subsided and for years he has had just some decreased range of motion.  No significant pain.  He wonders about getting physical therapy. No neck pain, no radiating pain or paresthesias.  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     psyllium (METAMUCIL) 58.6 % powder Take 1 packet by mouth as needed.     Saw Palmetto 450 MG CAPS Take by mouth 2 (two) times daily.     VITAMIN D PO Take 125 mcg by mouth daily.     desloratadine (CLARINEX) 5 MG tablet Take 1  tablet (5 mg total) by mouth daily. 90 tablet 3   fluticasone (FLONASE) 50 MCG/ACT nasal spray Place 2 sprays into both nostrils daily. 16 g 6   sodium chloride (OCEAN) 0.65 % SOLN nasal spray Place 1 spray into both nostrils as needed. 30 mL 5   No facility-administered medications prior to visit.    No Known Allergies  Review of Systems As per HPI  PE:    05/18/2023    1:21 PM 02/15/2023    8:36 AM 02/11/2023    7:27 PM  Vitals with BMI  Height  5\' 11"    Weight 175 lbs 174 lbs 6 oz   BMI  24.33   Systolic 101 136 284  Diastolic 69 80 77  Pulse 95 64 80     Physical Exam  General: Alert and well-appearing. Left shoulder with no deformity.  No tenderness to palpation.  He has forward flexion limited to about 110 degrees, extension limited to 45 degrees, abduction to 80 degrees, mild limitation of internal rotation and external rotation. Range of motion does not elicit any significant pain.  Good arm strength.  LABS:  Last CBC Lab Results  Component Value Date   WBC 10.1 02/11/2023   HGB 13.9 02/11/2023  HCT 39.8 02/11/2023   MCV 90.0 02/11/2023   MCH 31.4 02/11/2023   RDW 12.7 02/11/2023   PLT 217 02/11/2023   Last metabolic panel Lab Results  Component Value Date   GLUCOSE 106 (H) 02/11/2023   NA 139 02/11/2023   K 3.9 02/11/2023   CL 105 02/11/2023   CO2 28 02/11/2023   BUN 15 02/11/2023   CREATININE 0.74 02/11/2023   GFRNONAA >60 02/11/2023   CALCIUM 8.7 (L) 02/11/2023   PROT 6.6 02/10/2022   ALBUMIN 3.9 02/10/2022   BILITOT 0.7 02/10/2022   ALKPHOS 33 (L) 02/10/2022   AST 15 02/10/2022   ALT 13 02/10/2022   ANIONGAP 6 02/11/2023   Last lipids Lab Results  Component Value Date   CHOL 190 06/02/2021   HDL 41.80 06/02/2021   LDLCALC 134 (H) 06/02/2021   TRIG 72.0 06/02/2021   CHOLHDL 5 06/02/2021   Last hemoglobin A1c Lab Results  Component Value Date   HGBA1C 5.6 02/10/2022   Last thyroid functions Lab Results  Component Value Date    TSH 1.02 02/10/2022   IMPRESSION AND PLAN:  #1 difficulty controlling anger. He is doing much better with this. No meds or counseling at this time.  2.  Adhesive capsulitis left shoulder. Bedside MSK ultrasound today shows normal biceps tendon in short and long views, no subluxation.  He has some thickening and hypoechoic changes of the subscap, no tear. He has no significant supraspinatus tendon visible.  No cortical irregularity of the humerus.   Question remote traumatic complete supraspinatus tear. Physical therapy referral ordered today.  An After Visit Summary was printed and given to the patient.  FOLLOW UP: No follow-ups on file.  Signed:  Santiago Bumpers, MD           05/18/2023

## 2023-06-04 DIAGNOSIS — M62412 Contracture of muscle, left shoulder: Secondary | ICD-10-CM | POA: Diagnosis not present

## 2023-06-04 DIAGNOSIS — M25512 Pain in left shoulder: Secondary | ICD-10-CM | POA: Diagnosis not present

## 2023-06-08 DIAGNOSIS — M62412 Contracture of muscle, left shoulder: Secondary | ICD-10-CM | POA: Diagnosis not present

## 2023-06-08 DIAGNOSIS — M25512 Pain in left shoulder: Secondary | ICD-10-CM | POA: Diagnosis not present

## 2023-06-22 DIAGNOSIS — M25512 Pain in left shoulder: Secondary | ICD-10-CM | POA: Diagnosis not present

## 2023-06-22 DIAGNOSIS — M62412 Contracture of muscle, left shoulder: Secondary | ICD-10-CM | POA: Diagnosis not present

## 2023-06-28 ENCOUNTER — Encounter: Payer: Self-pay | Admitting: Urgent Care

## 2023-06-28 ENCOUNTER — Ambulatory Visit: Payer: Medicare Other | Admitting: Urgent Care

## 2023-06-28 VITALS — BP 137/77 | HR 82 | Temp 97.6°F | Wt 174.0 lb

## 2023-06-28 DIAGNOSIS — J0140 Acute pansinusitis, unspecified: Secondary | ICD-10-CM | POA: Diagnosis not present

## 2023-06-28 DIAGNOSIS — R051 Acute cough: Secondary | ICD-10-CM | POA: Diagnosis not present

## 2023-06-28 DIAGNOSIS — R509 Fever, unspecified: Secondary | ICD-10-CM

## 2023-06-28 LAB — POCT INFLUENZA A/B
Influenza A, POC: NEGATIVE
Influenza B, POC: NEGATIVE

## 2023-06-28 LAB — POC COVID19 BINAXNOW: SARS Coronavirus 2 Ag: NEGATIVE

## 2023-06-28 MED ORDER — FLUTICASONE PROPIONATE 50 MCG/ACT NA SUSP: 2.0000 | Freq: Every day | NASAL | 0 refills | Status: DC

## 2023-06-28 MED ORDER — AMOXICILLIN-POT CLAVULANATE 875-125 MG PO TABS: 1.0000 | ORAL_TABLET | Freq: Two times a day (BID) | ORAL | 0 refills | Status: AC

## 2023-06-28 MED ORDER — CETIRIZINE HCL 10 MG PO TABS: 10.0000 mg | ORAL_TABLET | Freq: Every day | ORAL | 0 refills | Status: DC

## 2023-06-28 NOTE — Progress Notes (Signed)
Established Patient Office Visit  Subjective:  Patient ID: Shannon King, male    DOB: 1951-02-24  Age: 73 y.o. MRN: [id number]  Chief Complaint  Patient presents with   Facial Pain    Pt states his symptoms started on Sunday which consist of fever, cough, facial pain and fluid coming out of his eyes. He has a sore in the top of his mouth that he's concerned about.    HPI Discussed the use of AI scribe software for clinical note transcription with the patient, who gave verbal consent to proceed.  History of Present Illness   The patient presents with symptoms of a head cold or sinus infection.  Symptoms began on Sunday night with a mild fever and general malaise. By Monday afternoon, symptoms worsened significantly, including a severe cough, headaches, and fever. He worked for six hours on Monday but felt very unwell by the time he returned home. On Tuesday, symptoms remained unchanged, but by Wednesday afternoon, he started feeling better, thinking it was just a bad head cold. However, symptoms returned last night, and he woke up this morning feeling extremely unwell.  The cough is described as dry, with no phlegm production, although he has been blowing his nose and producing some mucus. He has been using over-the-counter treatments, including emergency vitamin C, Coles lozenges, and Advil to manage fever. He has a small bump in his mouth, possibly from sucking on lozenges, but no significant sores or ulcers. No loss of taste or smell is reported, but he experiences flu-like symptoms, including body aches and generalized achiness.  He has experienced watery fluid coming from his eyes, which is unusual for him during a cold or sinus infection. His eyes have been slightly swollen, and vision appeared foggy until he cleaned them. He does not wear contacts, only glasses.  He has a history of sinus infections in the past, which were effectively treated with antibiotics.       There are  no active problems to display for this patient.  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   Difficulty controlling anger    Hypercholesterolemia 03/2019   pt declined statin trial 03/2019, 04/2020, 05/2021   Past Surgical History:  Procedure Laterality Date   COLONOSCOPY  09/21/2014   adenomas->recall 3-5 yrs   INGUINAL HERNIA REPAIR  1999   bilat   SPIROMETRY  2011   Normal   TONSILLECTOMY AND ADENOIDECTOMY  1965   Social History   Tobacco Use   Smoking status: Former    Current packs/day: 0.00    Types: Cigarettes    Quit date: 04/02/2012    Years since quitting: 11.2   Smokeless tobacco: Never  Vaping Use   Vaping status: Never Used  Substance Use Topics   Alcohol use: Yes    Alcohol/week: 15.0 standard drinks of alcohol    Types: 1 Glasses of wine, 14 Cans of beer per week   Drug use: Never      ROS: as noted in HPI  Objective:     BP 137/77   Pulse 82   Temp 97.6 F (36.4 C) (Oral)   Wt 174 lb (78.9 kg)   SpO2 98%   BMI 24.27 kg/m  BP Readings from Last 3 Encounters:  06/28/23 137/77  05/18/23 101/69  02/15/23 136/80   Wt Readings from Last 3 Encounters:  06/28/23 174 lb (78.9 kg)  05/18/23 175 lb (79.4 kg)  02/15/23 174 lb 6.4 oz (79.1 kg)      Physical Exam Vitals and nursing note reviewed.  Constitutional:      General: He is not in acute distress.    Appearance: Normal appearance. He is not ill-appearing, toxic-appearing or diaphoretic.  HENT:     Head: Normocephalic and atraumatic.     Salivary Glands: Right salivary gland is not diffusely enlarged or tender. Left salivary gland is not diffusely enlarged or tender.     Right Ear: No swelling or tenderness. No middle ear effusion. Tympanic membrane is injected and scarred (TM opacified). Tympanic membrane is not perforated or erythematous.     Left Ear: No swelling or tenderness.  No middle ear effusion.  Tympanic membrane is injected and scarred (opacified). Tympanic membrane is not perforated or erythematous.     Nose: Congestion and rhinorrhea present. Rhinorrhea is purulent.     Right Turbinates: Enlarged and swollen.     Left Turbinates: Enlarged and swollen.     Right Sinus: Maxillary sinus tenderness and frontal sinus tenderness present.     Left Sinus: Maxillary sinus tenderness and frontal sinus tenderness present.     Mouth/Throat:     Lips: Pink.     Mouth: Mucous membranes are moist. No oral lesions.     Pharynx: Oropharynx is clear. Uvula midline. Postnasal drip present. No pharyngeal swelling, oropharyngeal exudate, posterior oropharyngeal erythema or uvula swelling.  Eyes:     General: Lids are normal. No visual field deficit or scleral icterus.       Right eye: No discharge.        Left eye: No discharge.     Extraocular Movements:     Right eye: Normal extraocular motion and no nystagmus.     Left eye: Normal extraocular motion and no nystagmus.     Conjunctiva/sclera:     Right eye: Right conjunctiva is injected. No chemosis or exudate.    Left eye: Left conjunctiva is injected. No chemosis or exudate.    Comments: B eyes minimally injected without chemosis or ciliary flush  Cardiovascular:     Rate and Rhythm: Normal rate and regular rhythm.  Pulmonary:     Effort: Pulmonary effort is normal. No respiratory distress.     Breath sounds: Normal breath sounds. No stridor. No wheezing, rhonchi or rales.  Musculoskeletal:     Cervical back: Normal range of motion and neck supple. No tenderness.  Lymphadenopathy:     Cervical: No cervical adenopathy.  Skin:    General: Skin is warm.     Coloration: Skin is not jaundiced.     Findings: No bruising, erythema or rash.  Neurological:     General: No focal deficit present.     Mental Status: He is alert and oriented to person, place, and time.      Results for orders placed or performed in visit on 06/28/23  POC  COVID-19 BinaxNow  Result Value Ref Range   SARS Coronavirus 2 Ag Negative Negative  POCT Influenza A/B  Result Value Ref Range   Influenza A, POC Negative Negative   Influenza B, POC Negative Negative    Last CBC Lab Results  Component Value Date   WBC 10.1 02/11/2023   HGB 13.9 02/11/2023   HCT 39.8 02/11/2023   MCV 90.0 02/11/2023   MCH 31.4 02/11/2023   RDW 12.7 02/11/2023   PLT 217 02/11/2023   Last metabolic panel Lab Results  Component  Value Date   GLUCOSE 106 (H) 02/11/2023   NA 139 02/11/2023   K 3.9 02/11/2023   CL 105 02/11/2023   CO2 28 02/11/2023   BUN 15 02/11/2023   CREATININE 0.74 02/11/2023   GFRNONAA >60 02/11/2023   CALCIUM 8.7 (L) 02/11/2023   PROT 6.6 02/10/2022   ALBUMIN 3.9 02/10/2022   BILITOT 0.7 02/10/2022   ALKPHOS 33 (L) 02/10/2022   AST 15 02/10/2022   ALT 13 02/10/2022   ANIONGAP 6 02/11/2023   Last lipids Lab Results  Component Value Date   CHOL 190 06/02/2021   HDL 41.80 06/02/2021   LDLCALC 134 (H) 06/02/2021   TRIG 72.0 06/02/2021   CHOLHDL 5 06/02/2021   Last hemoglobin A1c Lab Results  Component Value Date   HGBA1C 5.6 02/10/2022      The 10-year ASCVD risk score (Arnett DK, et al., 2019) is: 21.2%  Assessment & Plan:  Acute cough -     POC COVID-19 BinaxNow -     POCT Influenza A/B  Fever, unspecified fever cause -     POC COVID-19 BinaxNow -     POCT Influenza A/B  Acute non-recurrent pansinusitis -     Amoxicillin-Pot Clavulanate; Take 1 tablet by mouth 2 (two) times daily with a meal for 10 days.  Dispense: 20 tablet; Refill: 0 -     Cetirizine HCl; Take 1 tablet (10 mg total) by mouth daily.  Dispense: 30 tablet; Refill: 0 -     Fluticasone Propionate; Place 2 sprays into both nostrils daily.  Dispense: 16 g; Refill: 0  Assessment and Plan    Upper Respiratory Infection Symptoms started on Sunday with fever, cough, and headache. Symptoms worsened on Monday. Patient reported improvement on Wednesday but  symptoms returned on Thursday. Noted watery discharge from eyes, which is a new symptom for the patient. No phlegm production with cough. No loss of taste or smell. Patient has been self-treating with Emergen-C, Cold-Eeze, and Advil. -COVID and flu tests performed in office, negative -Start antibiotics and nasal spray for bacterial sinusitis as suspected based on test results and clinical picture. -cetirizine to help with watering of eyes, no current s/sx of conjunctivitis  Oral Health Patient reported a small bump in the mouth, possibly related to sucking on cough drops. No visible ulceration noted on examination. -Monitor oral health, advise patient to report if symptoms worsen or persist.         No follow-ups on file.   Maretta Bees, PA

## 2023-06-28 NOTE — Patient Instructions (Signed)
You have a sinus/ lower respiratory tract infection. Please start taking the antibiotic, Augmentin, twice daily with food. Take it for all 10 days, do not stop early just because you feel better. Take an over the counter probiotic or yogurt daily to help prevent diarrhea/ yeast infection.  Start taking zyrtec once daily to help clear up the mucous. Use Flonase daily to help with inflammation of the nasal passage.  It is also recommended that you use nasal saline/ sinus washes to cleans the sinus passages. Hot steam from a shower or vaporizer may also be beneficial to help open up the upper airway. Eucalyptus can be helpful.  If any worsening symptoms such as headache, fever, or shortness of breath, please return for recheck.

## 2023-07-06 DIAGNOSIS — M25512 Pain in left shoulder: Secondary | ICD-10-CM | POA: Diagnosis not present

## 2023-07-06 DIAGNOSIS — M62412 Contracture of muscle, left shoulder: Secondary | ICD-10-CM | POA: Diagnosis not present

## 2023-07-12 DIAGNOSIS — M25512 Pain in left shoulder: Secondary | ICD-10-CM | POA: Diagnosis not present

## 2023-07-12 DIAGNOSIS — M62412 Contracture of muscle, left shoulder: Secondary | ICD-10-CM | POA: Diagnosis not present

## 2023-07-17 DIAGNOSIS — M25512 Pain in left shoulder: Secondary | ICD-10-CM | POA: Diagnosis not present

## 2023-07-17 DIAGNOSIS — M62412 Contracture of muscle, left shoulder: Secondary | ICD-10-CM | POA: Diagnosis not present

## 2023-07-20 DIAGNOSIS — M25512 Pain in left shoulder: Secondary | ICD-10-CM | POA: Diagnosis not present

## 2023-07-20 DIAGNOSIS — M62412 Contracture of muscle, left shoulder: Secondary | ICD-10-CM | POA: Diagnosis not present

## 2023-07-23 ENCOUNTER — Other Ambulatory Visit: Payer: Self-pay

## 2023-07-23 DIAGNOSIS — J0140 Acute pansinusitis, unspecified: Secondary | ICD-10-CM

## 2023-07-23 MED ORDER — CETIRIZINE HCL 10 MG PO TABS: 10.0000 mg | ORAL_TABLET | Freq: Every day | ORAL | 0 refills | Status: DC

## 2023-07-24 ENCOUNTER — Telehealth: Payer: Self-pay

## 2023-07-24 DIAGNOSIS — M62412 Contracture of muscle, left shoulder: Secondary | ICD-10-CM | POA: Diagnosis not present

## 2023-07-24 DIAGNOSIS — M25512 Pain in left shoulder: Secondary | ICD-10-CM | POA: Diagnosis not present

## 2023-07-24 NOTE — Telephone Encounter (Signed)
 Received rehab orders to be reviewed and signed. Placed in PCP office

## 2023-07-25 NOTE — Telephone Encounter (Signed)
 Signed and put in box to go up front. Signed:  Santiago Bumpers, MD           07/25/2023

## 2023-08-01 DIAGNOSIS — M25512 Pain in left shoulder: Secondary | ICD-10-CM | POA: Diagnosis not present

## 2023-08-01 DIAGNOSIS — M62412 Contracture of muscle, left shoulder: Secondary | ICD-10-CM | POA: Diagnosis not present

## 2023-08-07 DIAGNOSIS — M25512 Pain in left shoulder: Secondary | ICD-10-CM | POA: Diagnosis not present

## 2023-08-07 DIAGNOSIS — M62412 Contracture of muscle, left shoulder: Secondary | ICD-10-CM | POA: Diagnosis not present

## 2023-08-09 ENCOUNTER — Telehealth: Payer: Self-pay

## 2023-08-09 IMAGING — DX DG TOE GREAT 2+V*R*
1 series · 3 of 3 positions shown · non-contrast
Comparison: None.

CLINICAL DATA: Foot fracture follow-up. Blister to the right great
toe. Fracture reportedly noted on 04/20/2021. No prior exams
available, for.

EXAM:
RIGHT GREAT TOE

[Series 1: toe · 0.14mm/px · 3 of 3 slices shown]
[im 1/3]
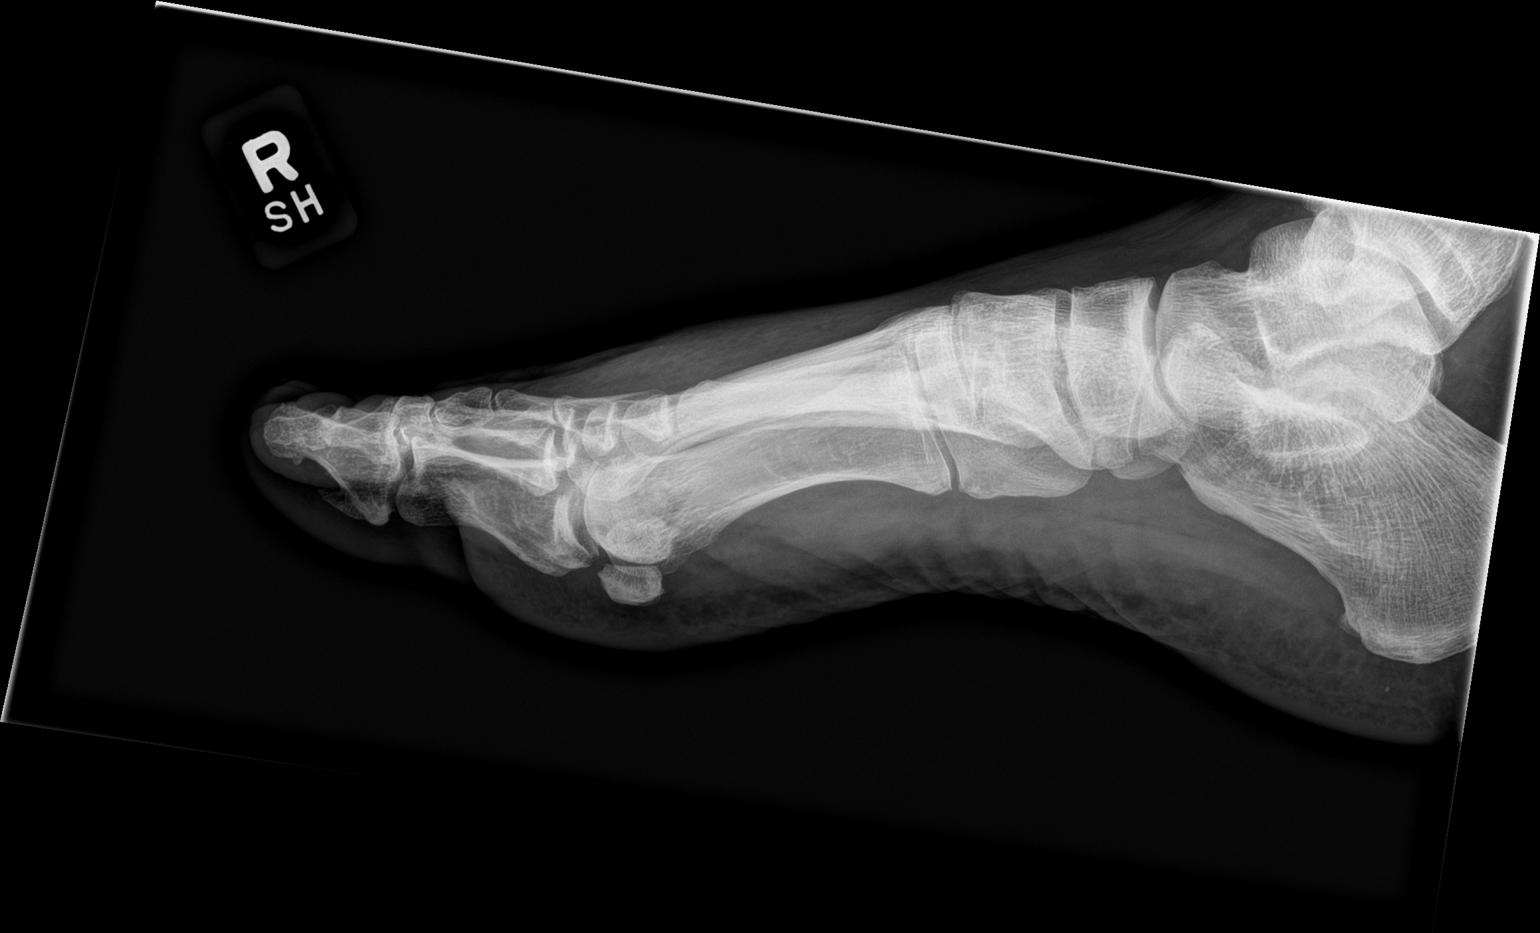
[im 2/3]
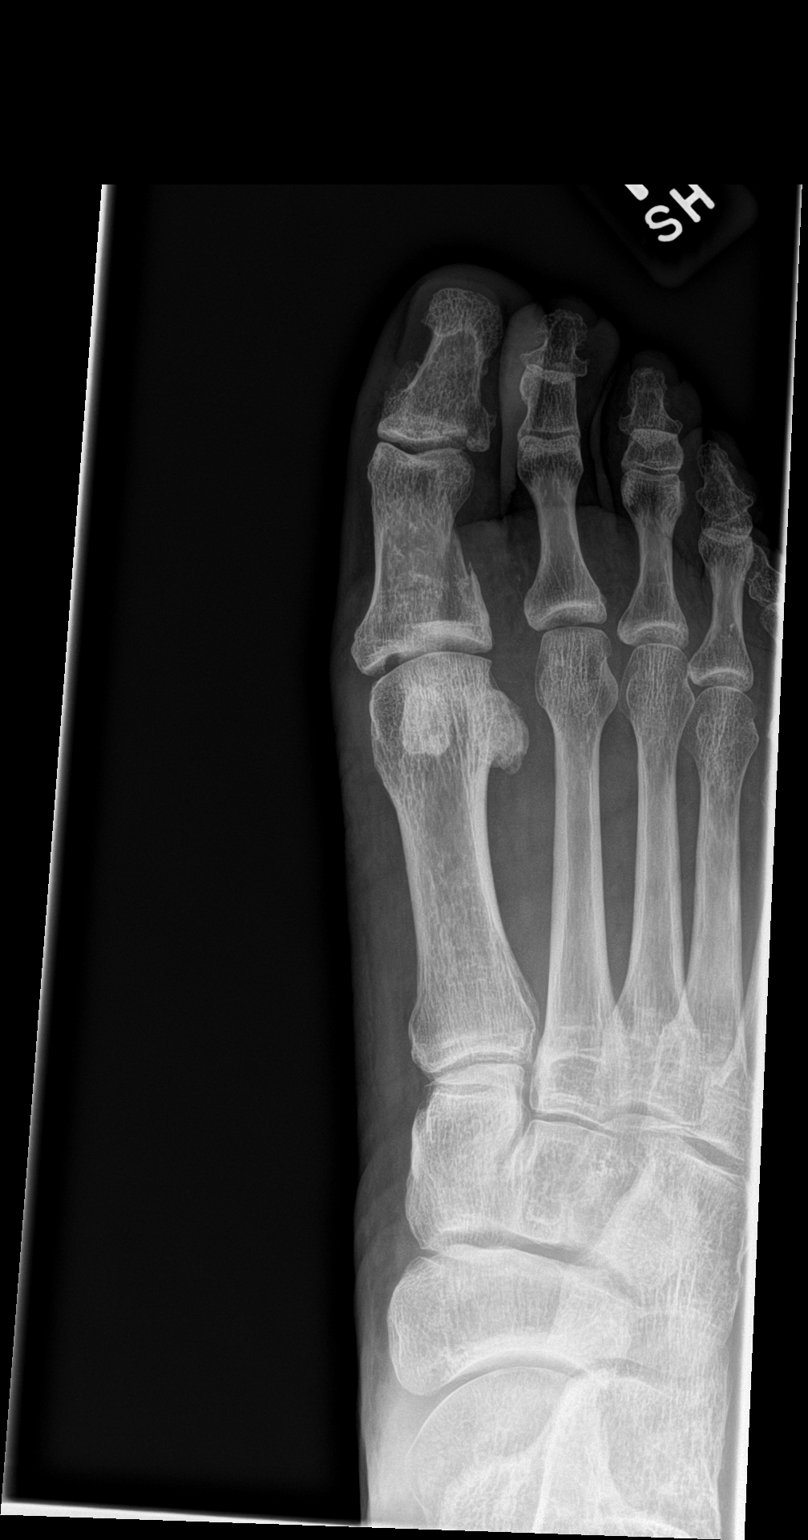
[im 3/3]
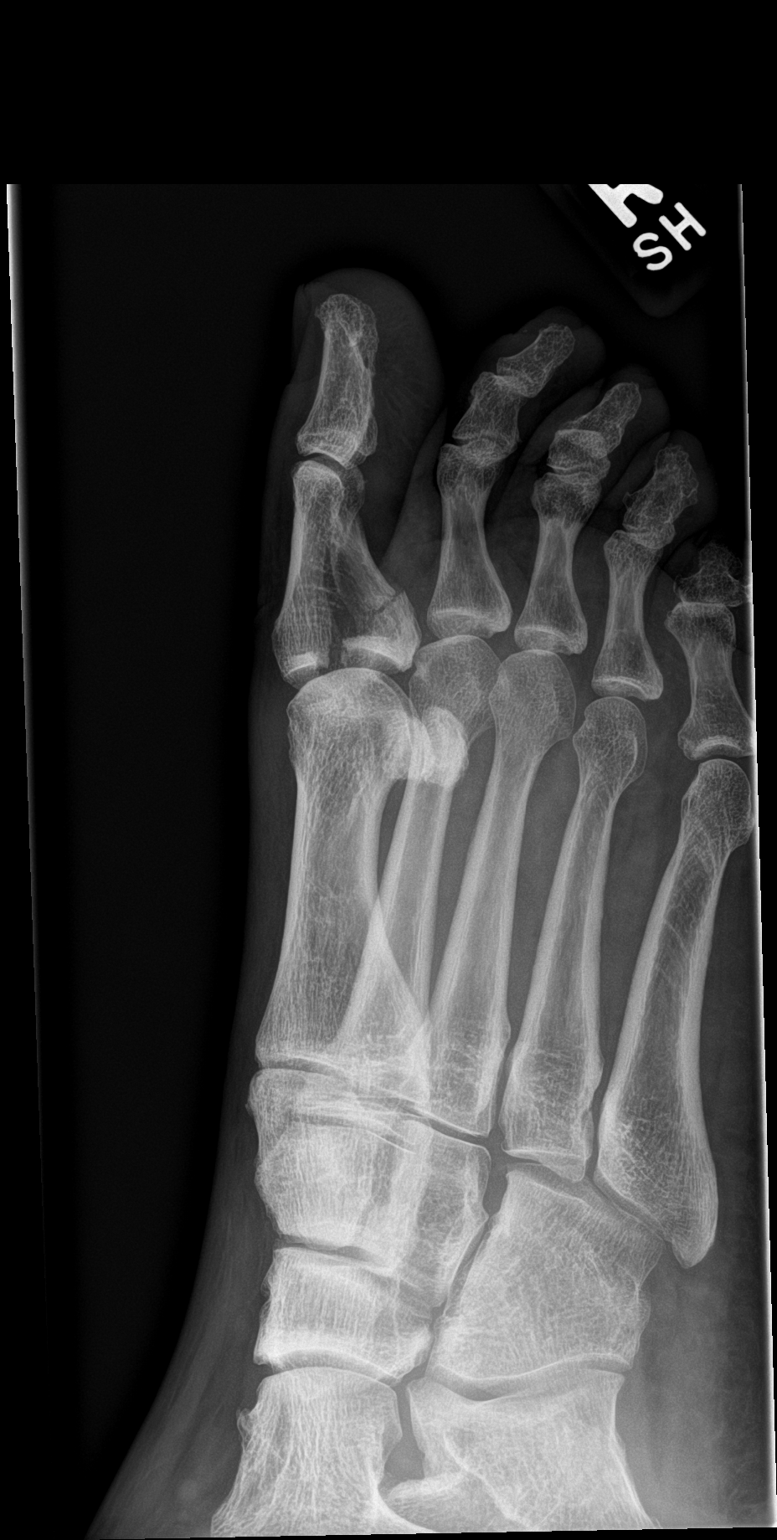

[3 of 3 positions shown; findings below may reference images not displayed]

FINDINGS: There are fractures of the proximal phalanx of the great toe. An
oblique coronal flexure line extends from the first MTP articular
surface to the distal aspect of the phalanx. A secondary fracture
line extends to the lateral plantar or base the proximal shaft.
Fracture is mildly displaced, 3-4 mm.

There is also a more subtle fracture across the lateral corner at
the base of the distal phalanx of the great toe. This is
nondisplaced.

No other fractures.  Joints are no spaced and aligned.
IMPRESSION: 1. Mildly comminuted, minimally displaced fracture of the proximal
phalanx of the great toe, within an interarticular component at the
MTP joint.
2. Small nondisplaced non comminuted fracture at the lateral base of
the distal phalanx of the great toe.
3. No other fractures and no dislocation.

## 2023-08-09 NOTE — Telephone Encounter (Signed)
 Received fax from [facility] regarding PT re-evaluation.  Placed on PCP desk to review and sign, if appropriate upon return on 3/17.

## 2023-08-10 NOTE — Telephone Encounter (Signed)
 Signed and put in box to go up front. Signed:  Santiago Bumpers, MD           08/10/2023

## 2023-08-13 NOTE — Telephone Encounter (Signed)
Forms faxed, confirmation received

## 2023-08-14 DIAGNOSIS — M25512 Pain in left shoulder: Secondary | ICD-10-CM | POA: Diagnosis not present

## 2023-08-14 DIAGNOSIS — M62412 Contracture of muscle, left shoulder: Secondary | ICD-10-CM | POA: Diagnosis not present

## 2023-08-17 DIAGNOSIS — M62412 Contracture of muscle, left shoulder: Secondary | ICD-10-CM | POA: Diagnosis not present

## 2023-08-17 DIAGNOSIS — M25512 Pain in left shoulder: Secondary | ICD-10-CM | POA: Diagnosis not present

## 2023-08-20 DIAGNOSIS — M25512 Pain in left shoulder: Secondary | ICD-10-CM | POA: Diagnosis not present

## 2023-08-20 DIAGNOSIS — M62412 Contracture of muscle, left shoulder: Secondary | ICD-10-CM | POA: Diagnosis not present

## 2023-08-27 DIAGNOSIS — M62412 Contracture of muscle, left shoulder: Secondary | ICD-10-CM | POA: Diagnosis not present

## 2023-08-27 DIAGNOSIS — M25512 Pain in left shoulder: Secondary | ICD-10-CM | POA: Diagnosis not present

## 2023-09-02 ENCOUNTER — Emergency Department (HOSPITAL_BASED_OUTPATIENT_CLINIC_OR_DEPARTMENT_OTHER)
Admission: EM | Admit: 2023-09-02 | Discharge: 2023-09-02 | Disposition: A | Discharge status: Home / Self Care | Attending: Emergency Medicine | Admitting: Emergency Medicine

## 2023-09-02 ENCOUNTER — Encounter (HOSPITAL_BASED_OUTPATIENT_CLINIC_OR_DEPARTMENT_OTHER): Payer: Self-pay | Admitting: Emergency Medicine

## 2023-09-02 ENCOUNTER — Other Ambulatory Visit: Payer: Self-pay

## 2023-09-02 DIAGNOSIS — L239 Allergic contact dermatitis, unspecified cause: Secondary | ICD-10-CM | POA: Insufficient documentation

## 2023-09-02 DIAGNOSIS — R21 Rash and other nonspecific skin eruption: Secondary | ICD-10-CM | POA: Diagnosis present

## 2023-09-02 LAB — CBC WITH DIFFERENTIAL/PLATELET
Abs Immature Granulocytes: 0.01 10*3/uL (ref 0.00–0.07)
Basophils Absolute: 0.1 10*3/uL (ref 0.0–0.1)
Basophils Relative: 1 %
Eosinophils Absolute: 0.4 10*3/uL (ref 0.0–0.5)
Eosinophils Relative: 5 %
HCT: 40.5 % (ref 39.0–52.0)
Hemoglobin: 13.9 g/dL (ref 13.0–17.0)
Immature Granulocytes: 0 %
Lymphocytes Relative: 33 %
Lymphs Abs: 2.4 10*3/uL (ref 0.7–4.0)
MCH: 30.6 pg (ref 26.0–34.0)
MCHC: 34.3 g/dL (ref 30.0–36.0)
MCV: 89.2 fL (ref 80.0–100.0)
Monocytes Absolute: 0.7 10*3/uL (ref 0.1–1.0)
Monocytes Relative: 10 %
Neutro Abs: 3.8 10*3/uL (ref 1.7–7.7)
Neutrophils Relative %: 51 %
Platelets: 225 10*3/uL (ref 150–400)
RBC: 4.54 MIL/uL (ref 4.22–5.81)
RDW: 12.8 % (ref 11.5–15.5)
WBC: 7.3 10*3/uL (ref 4.0–10.5)
nRBC: 0 % (ref 0.0–0.2)

## 2023-09-02 LAB — COMPREHENSIVE METABOLIC PANEL WITH GFR
ALT: 15 U/L (ref 0–44)
AST: 13 U/L — ABNORMAL LOW (ref 15–41)
Albumin: 3.9 g/dL (ref 3.5–5.0)
Alkaline Phosphatase: 35 U/L — ABNORMAL LOW (ref 38–126)
Anion gap: 6 (ref 5–15)
BUN: 14 mg/dL (ref 8–23)
CO2: 26 mmol/L (ref 22–32)
Calcium: 8.7 mg/dL — ABNORMAL LOW (ref 8.9–10.3)
Chloride: 105 mmol/L (ref 98–111)
Creatinine, Ser: 0.65 mg/dL (ref 0.61–1.24)
GFR, Estimated: >60 mL/min (ref >60)
Glucose, Bld: 110 mg/dL — ABNORMAL HIGH (ref 70–99)
Potassium: 4.1 mmol/L (ref 3.5–5.1)
Sodium: 137 mmol/L (ref 135–145)
Total Bilirubin: 0.6 mg/dL (ref 0.0–1.2)
Total Protein: 6.6 g/dL (ref 6.5–8.1)

## 2023-09-02 MED ORDER — PREDNISONE 10 MG PO TABS: ORAL_TABLET | ORAL | 0 refills | Status: AC

## 2023-09-02 MED ORDER — TRIAMCINOLONE ACETONIDE 0.1 % EX CREA: 1.0000 | TOPICAL_CREAM | Freq: Two times a day (BID) | CUTANEOUS | 0 refills | Status: DC | PRN

## 2023-09-02 MED ORDER — FAMOTIDINE IN NACL 20-0.9 MG/50ML-% IV SOLN
20.0000 mg | Freq: Once | INTRAVENOUS | Status: AC
  Administered 2023-09-02: 20 mg via INTRAVENOUS
  Filled 2023-09-02: qty 50

## 2023-09-02 MED ORDER — LORATADINE 10 MG PO TABS: 10.0000 mg | ORAL_TABLET | Freq: Every day | ORAL | 0 refills | Status: DC | PRN

## 2023-09-02 MED ORDER — DEXAMETHASONE SODIUM PHOSPHATE 10 MG/ML IJ SOLN
10.0000 mg | Freq: Once | INTRAMUSCULAR | Status: AC
  Administered 2023-09-02: 10 mg via INTRAVENOUS
  Filled 2023-09-02: qty 1

## 2023-09-02 MED ORDER — HYDROCORTISONE 1 % EX CREA
TOPICAL_CREAM | Freq: Once | CUTANEOUS | Status: AC
  Filled 2023-09-02: qty 28

## 2023-09-02 MED ORDER — DIPHENHYDRAMINE HCL 50 MG/ML IJ SOLN
25.0000 mg | Freq: Once | INTRAMUSCULAR | Status: AC
  Administered 2023-09-02: 25 mg via INTRAVENOUS
  Filled 2023-09-02: qty 1

## 2023-09-02 NOTE — ED Notes (Signed)
 Dc instructions reviewed with patient. Patient voiced understanding. Dc with belongings.

## 2023-09-02 NOTE — ED Provider Notes (Signed)
 [facility] AT [facility] Provider Note   CSN: [id number] Arrival date & time: 09/02/23  0701     History  Chief Complaint  Patient presents with   Rash    Shannon King is a 73 y.o. male.  Pt is a 73 yo male with pmhx significant for BPH, HLD, and anxiety.  Pt worked in the yard all day on Friday, 4/4.  He woke up yesterday morning with a rash on his palms, arms, and legs.  It is itchy.  He started googling and was concerned that he has leukemia or liver disease.  He did apply coconut oil to his hands this am and it did help the itching.  No sob.  No difficulty swallowing.       Home Medications Prior to Admission medications   Medication Sig Start Date End Date Taking? Authorizing Provider  loratadine (CLARITIN) 10 MG tablet Take 1 tablet (10 mg total) by mouth daily as needed for allergies (or rash). 09/02/23  Yes Jacalyn Lefevre, MD  predniSONE (DELTASONE) 10 MG tablet Take 6 tablets (60 mg total) by mouth daily for 3 days, THEN 4 tablets (40 mg total) daily for 3 days, THEN 2 tablets (20 mg total) daily for 3 days. 09/02/23 09/11/23 Yes Jacalyn Lefevre, MD  triamcinolone cream (KENALOG) 0.1 % Apply 1 Application topically 2 (two) times daily as needed (itching). 09/02/23  Yes Jacalyn Lefevre, MD  cetirizine (ZYRTEC) 10 MG tablet Take 1 tablet (10 mg total) by mouth daily. 07/23/23   Crain, Whitney L, PA  fluticasone (FLONASE) 50 MCG/ACT nasal spray Place 2 sprays into both nostrils daily. 06/28/23   Crain, Whitney L, PA  Omega-3 Fatty Acids (FISH OIL) 1000 MG CAPS Take by mouth daily.    [provider]  psyllium (METAMUCIL) 58.6 % powder Take 1 packet by mouth as needed.    [provider]  Saw Palmetto 450 MG CAPS Take by mouth 2 (two) times daily.    [provider]  VITAMIN D PO Take 125 mcg by mouth daily.    [provider]      Allergies    Patient has no known allergies.    Review of Systems   Review of  Systems  Skin:  Positive for rash.  All other systems reviewed and are negative.   Physical Exam Updated Vital Signs BP (!) 142/81   Pulse 70   Temp (!) 97.5 F (36.4 C) (Oral)   Resp 16   Wt 81.6 kg   SpO2 96%   BMI 25.10 kg/m  Physical Exam Vitals and nursing note reviewed.  Constitutional:      Appearance: Normal appearance.  HENT:     Head: Normocephalic and atraumatic.     Right Ear: External ear normal.     Left Ear: External ear normal.     Nose: Nose normal.     Mouth/Throat:     Mouth: Mucous membranes are moist.     Pharynx: Oropharynx is clear.  Eyes:     Extraocular Movements: Extraocular movements intact.     Conjunctiva/sclera: Conjunctivae normal.     Pupils: Pupils are equal, round, and reactive to light.  Cardiovascular:     Rate and Rhythm: Normal rate and regular rhythm.     Pulses: Normal pulses.     Heart sounds: Normal heart sounds.  Pulmonary:     Effort: Pulmonary effort is normal.     Breath sounds: Normal breath sounds.  Abdominal:     General: Abdomen is flat. Bowel sounds are normal.     Palpations: Abdomen is soft.  Musculoskeletal:        General: Normal range of motion.     Cervical back: Normal range of motion and neck supple.  Skin:    Capillary Refill: Capillary refill takes less than 2 seconds.     Findings: Rash present. Rash is macular and papular.     Comments: Rash to palms, legs, arms, torso.  Appears to be a contact dermatitis.  Neurological:     General: No focal deficit present.     Mental Status: He is alert and oriented to person, place, and time.  Psychiatric:        Mood and Affect: Mood normal.     ED Results / Procedures / Treatments   Labs (all labs ordered are listed, but only abnormal results are displayed) Labs Reviewed  COMPREHENSIVE METABOLIC PANEL WITH GFR - Abnormal; Notable for the following components:      Result Value   Glucose, Bld 110 (*)    Calcium 8.7 (*)    AST 13 (*)    Alkaline  Phosphatase 35 (*)    All other components within normal limits  CBC WITH DIFFERENTIAL/PLATELET    EKG None  Radiology No results found.  Procedures Procedures    Medications Ordered in ED Medications  famotidine (PEPCID) IVPB 20 mg premix (20 mg Intravenous New Bag/Given 09/02/23 0746)  dexamethasone (DECADRON) injection 10 mg (10 mg Intravenous Given 09/02/23 0738)  diphenhydrAMINE (BENADRYL) injection 25 mg (25 mg Intravenous Given 09/02/23 0742)  hydrocortisone cream 1 % ( Topical Given 09/02/23 0802)    ED Course/ Medical Decision Making/ A&P                                 Medical Decision Making Amount and/or Complexity of Data Reviewed Labs: ordered.  Risk OTC drugs. Prescription drug management.   This patient presents to the ED for concern of rash, this involves an extensive number of treatment options, and is a complaint that carries with it a high risk of complications and morbidity.  The differential diagnosis includes contact dermatitis, cellulitis, infection   Co morbidities that complicate the patient evaluation  BPH, HLD, and anxiety   Additional history obtained:  Additional history obtained from epic chart review  Lab Tests:  I Ordered, and personally interpreted labs.  The pertinent results include:  cbc nl, cmp nl  Medicines ordered and prescription drug management:  I ordered medication including benadryl, solumedrol, pepcid  for sx  Reevaluation of the patient after these medicines showed that the patient improved I have reviewed the patients home medicines and have made adjustments as needed  Critical Interventions:  Allergic tx   Problem List / ED Course:  Contact dermatitis: Pt is feeling better after tx. Pt is d/c with kenalog crm, prednisone, and antihistamine.  He is instructed to return if worse.  F/u with pcp.  Pt is reassured that his labs were ok.  He said his mom died of leukemia which is what made him so  worried.   Reevaluation:  After the interventions noted above, I reevaluated the patient and found that they have :improved   Social Determinants of Health:  Lives at home   Dispostion:  After consideration of the diagnostic results and the patients response to treatment, I feel that the patent would  benefit from discharge with outpatient f/u.          Final Clinical Impression(s) / ED Diagnoses Final diagnoses:  Allergic contact dermatitis, unspecified trigger    Rx / DC Orders ED Discharge Orders          Ordered    triamcinolone cream (KENALOG) 0.1 %  2 times daily PRN        09/02/23 0724    predniSONE (DELTASONE) 10 MG tablet  Daily        09/02/23 0814    loratadine (CLARITIN) 10 MG tablet  Daily PRN        09/02/23 5621              Jacalyn Lefevre, MD 09/02/23 [phone]

## 2023-09-02 NOTE — ED Triage Notes (Signed)
 Rash to hands ,torso, since Friday, did work in the yard on Friday, has not taken any benadryl.

## 2023-09-04 DIAGNOSIS — M25512 Pain in left shoulder: Secondary | ICD-10-CM | POA: Diagnosis not present

## 2023-09-04 DIAGNOSIS — M62412 Contracture of muscle, left shoulder: Secondary | ICD-10-CM | POA: Diagnosis not present

## 2023-09-11 DIAGNOSIS — M25512 Pain in left shoulder: Secondary | ICD-10-CM | POA: Diagnosis not present

## 2023-09-11 DIAGNOSIS — M62412 Contracture of muscle, left shoulder: Secondary | ICD-10-CM | POA: Diagnosis not present

## 2023-09-17 DIAGNOSIS — M25512 Pain in left shoulder: Secondary | ICD-10-CM | POA: Diagnosis not present

## 2023-09-17 DIAGNOSIS — M62412 Contracture of muscle, left shoulder: Secondary | ICD-10-CM | POA: Diagnosis not present

## 2023-09-24 DIAGNOSIS — M62412 Contracture of muscle, left shoulder: Secondary | ICD-10-CM | POA: Diagnosis not present

## 2023-09-24 DIAGNOSIS — M25512 Pain in left shoulder: Secondary | ICD-10-CM | POA: Diagnosis not present

## 2023-10-01 DIAGNOSIS — M25512 Pain in left shoulder: Secondary | ICD-10-CM | POA: Diagnosis not present

## 2023-10-01 DIAGNOSIS — M62412 Contracture of muscle, left shoulder: Secondary | ICD-10-CM | POA: Diagnosis not present

## 2023-10-09 DIAGNOSIS — M62412 Contracture of muscle, left shoulder: Secondary | ICD-10-CM | POA: Diagnosis not present

## 2023-10-09 DIAGNOSIS — M25512 Pain in left shoulder: Secondary | ICD-10-CM | POA: Diagnosis not present

## 2023-11-19 ENCOUNTER — Encounter: Payer: Self-pay | Admitting: Family Medicine

## 2023-11-19 ENCOUNTER — Ambulatory Visit: Payer: Self-pay | Admitting: Family Medicine

## 2023-11-19 ENCOUNTER — Ambulatory Visit (INDEPENDENT_AMBULATORY_CARE_PROVIDER_SITE_OTHER): Payer: Medicare Other | Admitting: Family Medicine

## 2023-11-19 VITALS — BP 111/76 | HR 68 | Temp 97.7°F | Ht 71.0 in | Wt 168.0 lb

## 2023-11-19 DIAGNOSIS — Z125 Encounter for screening for malignant neoplasm of prostate: Secondary | ICD-10-CM

## 2023-11-19 DIAGNOSIS — Z0000 Encounter for general adult medical examination without abnormal findings: Secondary | ICD-10-CM

## 2023-11-19 LAB — PSA, MEDICARE: PSA: 1.11 ng/mL (ref 0.10–4.00)

## 2023-11-19 LAB — LIPID PANEL
Cholesterol: 159 mg/dL (ref 0–200)
HDL: 42.2 mg/dL (ref >39.00)
LDL Cholesterol: 104 mg/dL — ABNORMAL HIGH (ref 0–99)
NonHDL: 116.59
Total CHOL/HDL Ratio: 4
Triglycerides: 63 mg/dL (ref 0.0–149.0)
VLDL: 12.6 mg/dL (ref 0.0–40.0)

## 2023-11-19 NOTE — Progress Notes (Signed)
 Office Note 11/19/2023  CC:  Chief Complaint  Patient presents with   Annual Exam    Pt is fasting   Patient is a 73 y.o. male who is here for annual health maintenance exam. A/P as of last visit: #1 difficulty controlling anger. He is doing much better with this. No meds or counseling at this time.   2.  Adhesive capsulitis left shoulder. Bedside [facility] ultrasound today shows normal biceps tendon in short and long views, no subluxation.  He has some thickening and hypoechoic changes of the subscap, no tear. He has no significant supraspinatus tendon visible.  No cortical irregularity of the humerus.   Question remote traumatic complete supraspinatus tear. Physical therapy referral ordered today.  INTERIM HX: Shannon King feels good. He quit his job at [employer] last week and plans on taking the summer off and then starting some odd jobs.  Shoulder feels much better with rehab.    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   Difficulty controlling anger    Hypercholesterolemia 03/2019   pt declined statin trial 03/2019, 04/2020, 05/2021    Past Surgical History:  Procedure Laterality Date   COLONOSCOPY  09/21/2014   adenomas->recall 3-5 yrs   INGUINAL HERNIA REPAIR  1999   bilat   SPIROMETRY  2011   Normal   TONSILLECTOMY AND ADENOIDECTOMY  1965    Family History  Problem Relation Age of Onset   Leukemia Mother    Diabetes Mother    Hearing loss Mother    Hearing loss Father    Stroke Father    Diabetes Father    Lung cancer Maternal Grandfather    Diabetes Paternal Grandfather     Social History   Socioeconomic History   Marital status: Married    Spouse name: Not on file   Number of children: Not on file   Years of education: Not on file   Highest education level: Not on file  Occupational History   Not on file  Tobacco Use   Smoking status: Former    Current  packs/day: 0.00    Types: Cigarettes    Quit date: 04/02/2012    Years since quitting: 11.6   Smokeless tobacco: Never  Vaping Use   Vaping status: Never Used  Substance and Sexual Activity   Alcohol use: Yes    Alcohol/week: 15.0 standard drinks of alcohol    Types: 1 Glasses of wine, 14 Cans of beer per week   Drug use: Never   Sexual activity: Not on file  Other Topics Concern   Not on file  Social History Narrative   Married, no children.   Relocated to [place] from [place]  2019.     Educ: 2 yrs college   Occup: Team [employer].  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   Former smoker: 20 pack-yr hx, quit 2013.   Alcohol: 2 beers per day, rare liquor.      Social Drivers of Corporate investment banker Strain: Not on file  Food Insecurity: Not on file  Transportation Needs: Not on file  Physical Activity: Not on file  Stress: Not on file  Social Connections: Not on file  Intimate Partner Violence: Not on file    Outpatient Medications Prior to Visit  Medication Sig Dispense Refill   Omega-3 Fatty Acids (FISH OIL) 1000 MG CAPS Take  by mouth daily.     psyllium (METAMUCIL) 58.6 % powder Take 1 packet by mouth as needed.     VITAMIN D  PO Take 125 mcg by mouth daily.     cetirizine  (ZYRTEC ) 10 MG tablet Take 1 tablet (10 mg total) by mouth daily. 90 tablet 0   fluticasone  (FLONASE ) 50 MCG/ACT nasal spray Place 2 sprays into both nostrils daily. 16 g 0   loratadine  (CLARITIN ) 10 MG tablet Take 1 tablet (10 mg total) by mouth daily as needed for allergies (or rash). 30 tablet 0   Saw Palmetto 450 MG CAPS Take by mouth 2 (two) times daily.     triamcinolone  cream (KENALOG ) 0.1 % Apply 1 Application topically 2 (two) times daily as needed (itching). 30 g 0   No facility-administered medications prior to visit.    No Known Allergies  Review of Systems  Constitutional:  Negative for appetite change, chills, fatigue and  fever.  HENT:  Negative for congestion, dental problem, ear pain and sore throat.   Eyes:  Negative for discharge, redness and visual disturbance.  Respiratory:  Negative for cough, chest tightness, shortness of breath and wheezing.   Cardiovascular:  Negative for chest pain, palpitations and leg swelling.  Gastrointestinal:  Negative for abdominal pain, blood in stool, diarrhea, nausea and vomiting.  Genitourinary:  Negative for difficulty urinating, dysuria, flank pain, frequency, hematuria and urgency.  Musculoskeletal:  Negative for arthralgias, back pain, joint swelling, myalgias and neck stiffness.  Skin:  Negative for pallor and rash.  Neurological:  Negative for dizziness, speech difficulty, weakness and headaches.  Hematological:  Negative for adenopathy. Does not bruise/bleed easily.  Psychiatric/Behavioral:  Negative for confusion and sleep disturbance. The patient is not nervous/anxious.     PE;    11/19/2023    8:51 AM 09/02/2023    8:35 AM 09/02/2023    7:09 AM  Vitals with BMI  Height 5' 11    Weight 168 lbs    BMI 23.44    Systolic 111 129 857  Diastolic 76 83 81  Pulse 68 64 70    Gen: Alert, well appearing.  Patient is oriented to person, place, time, and situation. AFFECT: pleasant, lucid thought and speech. ENT: Ears: EACs clear, normal epithelium.  TMs with good light reflex and landmarks bilaterally.  Eyes: no injection, icteris, swelling, or exudate.  EOMI, PERRLA. Nose: no drainage or turbinate edema/swelling.  No injection or focal lesion.  Mouth: lips without lesion/swelling.  Oral mucosa pink and moist.  Dentition intact and without obvious caries or gingival swelling.  Oropharynx without erythema, exudate, or swelling.  Neck: supple/nontender.  No LAD, mass, or TM.  Carotid pulses 2+ bilaterally, without bruits. CV: RRR, no m/r/g.   LUNGS: CTA bilat, nonlabored resps, good aeration in all lung fields. ABD: soft, NT, ND, BS normal.  No hepatospenomegaly or  mass.  No bruits. EXT: no clubbing, cyanosis, or edema.  Musculoskeletal: no joint swelling, erythema, warmth, or tenderness.  ROM of all joints intact. Skin - no sores or suspicious lesions or rashes or color changes  Pertinent labs:  Lab Results  Component Value Date   TSH 1.02 02/10/2022   Lab Results  Component Value Date   WBC 7.3 09/02/2023   HGB 13.9 09/02/2023   HCT 40.5 09/02/2023   MCV 89.2 09/02/2023   PLT 225 09/02/2023   Lab Results  Component Value Date   CREATININE 0.65 09/02/2023   BUN 14 09/02/2023   NA 137 09/02/2023  K 4.1 09/02/2023   CL 105 09/02/2023   CO2 26 09/02/2023   Lab Results  Component Value Date   ALT 15 09/02/2023   AST 13 (L) 09/02/2023   ALKPHOS 35 (L) 09/02/2023   BILITOT 0.6 09/02/2023   Lab Results  Component Value Date   CHOL 190 06/02/2021   Lab Results  Component Value Date   HDL 41.80 06/02/2021   Lab Results  Component Value Date   LDLCALC 134 (H) 06/02/2021   Lab Results  Component Value Date   TRIG 72.0 06/02/2021   Lab Results  Component Value Date   CHOLHDL 5 06/02/2021   Lab Results  Component Value Date   PSA 0.75 11/16/2022   PSA 0.81 06/02/2021   PSA 0.77 05/18/2020   Lab Results  Component Value Date   HGBA1C 5.6 02/10/2022   ASSESSMENT AND PLAN:   #1 health maintenance exam: Reviewed age and gender appropriate health maintenance issues (prudent diet, regular exercise, health risks of tobacco and excessive alcohol, use of seatbelts, fire alarms in home, use of sunscreen).  Also reviewed age and gender appropriate health screening as well as vaccine recommendations. Vaccines: Tdap->declined. Says he had one in [place]  about 6 yrs ago.  Shingrix->declines #2 b/c #1 caused dry/uncomfortable skin changes. Labs:  psa today.  He declines any further labs today. Prostate ca screening: PSA today. Colon ca screening: adenomas 2016, due for recall->pt declines any further scopes or other colon ca  screening.  An After Visit Summary was printed and given to the patient.  FOLLOW UP:  Return in about 1 year (around 11/18/2024) for annual CPE (fasting).  Signed:  Gerlene Hockey, MD           11/19/2023

## 2023-11-19 NOTE — Patient Instructions (Signed)
 Health Maintenance, Male  Adopting a healthy lifestyle and getting preventive care are important in promoting health and wellness. Ask your health care provider about:  The right schedule for you to have regular tests and exams.  Things you can do on your own to prevent diseases and keep yourself healthy.  What should I know about diet, weight, and exercise?  Eat a healthy diet    Eat a diet that includes plenty of vegetables, fruits, low-fat dairy products, and lean protein.  Do not eat a lot of foods that are high in solid fats, added sugars, or sodium.  Maintain a healthy weight  Body mass index (BMI) is a measurement that can be used to identify possible weight problems. It estimates body fat based on height and weight. Your health care provider can help determine your BMI and help you achieve or maintain a healthy weight.  Get regular exercise  Get regular exercise. This is one of the most important things you can do for your health. Most adults should:  Exercise for at least 150 minutes each week. The exercise should increase your heart rate and make you sweat (moderate-intensity exercise).  Do strengthening exercises at least twice a week. This is in addition to the moderate-intensity exercise.  Spend less time sitting. Even light physical activity can be beneficial.  Watch cholesterol and blood lipids  Have your blood tested for lipids and cholesterol at 71 years of age, then have this test every 5 years.  You may need to have your cholesterol levels checked more often if:  Your lipid or cholesterol levels are high.  You are older than 71 years of age.  You are at high risk for heart disease.  What should I know about cancer screening?  Many types of cancers can be detected early and may often be prevented. Depending on your health history and family history, you may need to have cancer screening at various ages. This may include screening for:  Colorectal cancer.  Prostate cancer.  Skin cancer.  Lung  cancer.  What should I know about heart disease, diabetes, and high blood pressure?  Blood pressure and heart disease  High blood pressure causes heart disease and increases the risk of stroke. This is more likely to develop in people who have high blood pressure readings or are overweight.  Talk with your health care provider about your target blood pressure readings.  Have your blood pressure checked:  Every 3-5 years if you are 71-95 years of age.  Every year if you are 71 years old or older.  If you are between the ages of 29 and 29 and are a current or former smoker, ask your health care provider if you should have a one-time screening for abdominal aortic aneurysm (AAA).  Diabetes  Have regular diabetes screenings. This checks your fasting blood sugar level. Have the screening done:  Once every three years after age 23 if you are at a normal weight and have a low risk for diabetes.  More often and at a younger age if you are overweight or have a high risk for diabetes.  What should I know about preventing infection?  Hepatitis B  If you have a higher risk for hepatitis B, you should be screened for this virus. Talk with your health care provider to find out if you are at risk for hepatitis B infection.  Hepatitis C  Blood testing is recommended for:  Everyone born from [birth year] through 1963.  Anyone  with known risk factors for hepatitis C.  Sexually transmitted infections (STIs)  You should be screened each year for STIs, including gonorrhea and chlamydia, if:  You are sexually active and are younger than 71 years of age.  You are older than 71 years of age and your health care provider tells you that you are at risk for this type of infection.  Your sexual activity has changed since you were last screened, and you are at increased risk for chlamydia or gonorrhea. Ask your health care provider if you are at risk.  Ask your health care provider about whether you are at high risk for HIV. Your health care provider  may recommend a prescription medicine to help prevent HIV infection. If you choose to take medicine to prevent HIV, you should first get tested for HIV. You should then be tested every 3 months for as long as you are taking the medicine.  Follow these instructions at home:  Alcohol use  Do not drink alcohol if your health care provider tells you not to drink.  If you drink alcohol:  Limit how much you have to 0-2 drinks a day.  Know how much alcohol is in your drink. In the [place] one drink equals one 12 oz bottle of beer (355 mL), one 5 oz glass of wine (148 mL), or one 1 oz glass of hard liquor (44 mL).  Lifestyle  Do not use any products that contain nicotine or tobacco. These products include cigarettes, chewing tobacco, and vaping devices, such as e-cigarettes. If you need help quitting, ask your health care provider.  Do not use street drugs.  Do not share needles.  Ask your health care provider for help if you need support or information about quitting drugs.  General instructions  Schedule regular health, dental, and eye exams.  Stay current with your vaccines.  Tell your health care provider if:  You often feel depressed.  You have ever been abused or do not feel safe at home.  Summary  Adopting a healthy lifestyle and getting preventive care are important in promoting health and wellness.  Follow your health care provider's instructions about healthy diet, exercising, and getting tested or screened for diseases.  Follow your health care provider's instructions on monitoring your cholesterol and blood pressure.  This information is not intended to replace advice given to you by your health care provider. Make sure you discuss any questions you have with your health care provider.  Document Revised: 10/04/2020 Document Reviewed: 10/04/2020  Elsevier Patient Education  2024 [employer].

## 2024-03-05 DIAGNOSIS — H43813 Vitreous degeneration, bilateral: Secondary | ICD-10-CM | POA: Diagnosis not present

## 2024-03-05 DIAGNOSIS — H02839 Dermatochalasis of unspecified eye, unspecified eyelid: Secondary | ICD-10-CM | POA: Diagnosis not present

## 2024-03-05 DIAGNOSIS — H524 Presbyopia: Secondary | ICD-10-CM | POA: Diagnosis not present

## 2024-03-05 DIAGNOSIS — H25813 Combined forms of age-related cataract, bilateral: Secondary | ICD-10-CM | POA: Diagnosis not present

## 2024-03-05 DIAGNOSIS — H35363 Drusen (degenerative) of macula, bilateral: Secondary | ICD-10-CM | POA: Diagnosis not present

## 2024-03-18 ENCOUNTER — Ambulatory Visit: Payer: Self-pay

## 2024-03-18 NOTE — Telephone Encounter (Signed)
 Patient reports dry cough, nasal and head congestion and hoarse voice. Patient is offered an appointment tomorrow. Patient refused appointment and states he wants to be seen today. Patient states he will go to urgent care today.   FYI Only or Action Required?: FYI only for provider.  Patient was last seen in primary care on 11/19/2023 by McGowen, Aleene DEL, MD.  Called Nurse Triage reporting Cough.  Symptoms began yesterday.  Interventions attempted: Rest, hydration, or home remedies.  Symptoms are: unchanged.  Triage Disposition: See PCP When Office is Open (Within 3 Days)  Patient/caregiver understands and will follow disposition?:   Copied from CRM #[id number]. Topic: Clinical - Red Word Triage >> Mar 18, 2024  8:46 AM Vena HERO wrote: Red Word that prompted transfer to Nurse Triage: sore throat yesterday today voice is hoarse, no fever but has a slight productive cough with nose/head congestion. Feels like virus or something Reason for Disposition  [1] Also has allergy symptoms (e.g., itchy eyes, clear nasal discharge, postnasal drip) AND [2] they are acting up  Answer Assessment - Initial Assessment Questions 1. ONSET: When did the cough begin?      Started Saturday 2. SEVERITY: How bad is the cough today?      mild 3. SPUTUM: Describe the color of your sputum (e.g., none, dry cough; clear, white, yellow, green)     dry 4. HEMOPTYSIS: Are you coughing up any blood? If Yes, ask: How much? (e.g., flecks, streaks, tablespoons, etc.)     no 5. DIFFICULTY BREATHING: Are you having difficulty breathing? If Yes, ask: How bad is it? (e.g., mild, moderate, severe)      no 6. FEVER: Do you have a fever? If Yes, ask: What is your temperature, how was it measured, and when did it start?     no 7. CARDIAC HISTORY: Do you have any history of heart disease? (e.g., heart attack, congestive heart failure)      no 8. LUNG HISTORY: Do you have any history of lung disease?   (e.g., pulmonary embolus, asthma, emphysema)     no 9. PE RISK FACTORS: Do you have a history of blood clots? (or: recent major surgery, recent prolonged travel, bedridden)     no 10. OTHER SYMPTOMS: Do you have any other symptoms? (e.g., runny nose, wheezing, chest pain)       Nasal congestion, head congestion,  12. TRAVEL: Have you traveled out of the country in the last month? (e.g., travel history, exposures)       no  Protocols used: Cough - Acute Non-Productive-A-AH

## 2024-05-21 ENCOUNTER — Other Ambulatory Visit: Payer: Self-pay

## 2024-05-21 ENCOUNTER — Emergency Department (HOSPITAL_BASED_OUTPATIENT_CLINIC_OR_DEPARTMENT_OTHER)
Admission: EM | Admit: 2024-05-21 | Discharge: 2024-05-21 | Disposition: A | Discharge status: Home / Self Care | Attending: Emergency Medicine | Admitting: Emergency Medicine

## 2024-05-21 ENCOUNTER — Emergency Department (HOSPITAL_BASED_OUTPATIENT_CLINIC_OR_DEPARTMENT_OTHER): Admitting: Radiology

## 2024-05-21 ENCOUNTER — Encounter (HOSPITAL_BASED_OUTPATIENT_CLINIC_OR_DEPARTMENT_OTHER): Payer: Self-pay | Admitting: Emergency Medicine

## 2024-05-21 DIAGNOSIS — R5383 Other fatigue: Secondary | ICD-10-CM | POA: Insufficient documentation

## 2024-05-21 DIAGNOSIS — M791 Myalgia, unspecified site: Secondary | ICD-10-CM | POA: Insufficient documentation

## 2024-05-21 DIAGNOSIS — J111 Influenza due to unidentified influenza virus with other respiratory manifestations: Secondary | ICD-10-CM

## 2024-05-21 DIAGNOSIS — R911 Solitary pulmonary nodule: Secondary | ICD-10-CM | POA: Insufficient documentation

## 2024-05-21 DIAGNOSIS — R059 Cough, unspecified: Secondary | ICD-10-CM | POA: Insufficient documentation

## 2024-05-21 DIAGNOSIS — R0981 Nasal congestion: Secondary | ICD-10-CM | POA: Insufficient documentation

## 2024-05-21 LAB — RESP PANEL BY RT-PCR (RSV, FLU A&B, COVID)  RVPGX2
Influenza A by PCR: NEGATIVE
Influenza B by PCR: NEGATIVE
Resp Syncytial Virus by PCR: NEGATIVE
SARS Coronavirus 2 by RT PCR: NEGATIVE

## 2024-05-21 NOTE — ED Provider Notes (Signed)
 " [place] [facility] AT [facility] Provider Note   CSN: [id number] Arrival date & time: 05/21/24  1352     Patient presents with: Cough   Shannon King is a 73 y.o. male.   HPI Patient reports that he and his wife went to a Christmas party 2 days ago.  He reports he noticed that they had TheraFlu sitting out on the counter and suspected that somebody was sick at the party.  He reports that the next day he felt little fatigued and achy like he was coming down with something.  He reports now has had some nasal congestion cough body aches and feels like he has fever.  Patient reports he thinks he has the flu.  His wife has gone to another urgent care and been diagnosed positive with influenza.  Patient denies chest pain or shortness of breath.  He denies lower extremity swelling.  Denies nausea or vomiting.    Prior to Admission medications  Medication Sig Start Date End Date Taking? Authorizing Provider  Omega-3 Fatty Acids (FISH OIL) 1000 MG CAPS Take by mouth daily.    [provider]  psyllium (METAMUCIL) 58.6 % powder Take 1 packet by mouth as needed.    [provider]  VITAMIN D  PO Take 125 mcg by mouth daily.    [provider]    Allergies: Patient has no known allergies.    Review of Systems  Updated Vital Signs BP (!) 153/78   Pulse 98   Temp 98.9 F (37.2 C)   Resp 17   SpO2 97%   Physical Exam Constitutional:      Comments: Alert nontoxic well in appearance.  HENT:     Mouth/Throat:     Pharynx: Oropharynx is clear.  Cardiovascular:     Rate and Rhythm: Normal rate and regular rhythm.  Pulmonary:     Effort: Pulmonary effort is normal.     Breath sounds: Normal breath sounds.  Abdominal:     General: There is no distension.     Palpations: Abdomen is soft.     Tenderness: There is no abdominal tenderness. There is no guarding.  Musculoskeletal:        General: No swelling or tenderness. Normal range of  motion.     Right lower leg: No edema.     Left lower leg: No edema.  Skin:    General: Skin is warm and dry.  Neurological:     General: No focal deficit present.     Mental Status: He is oriented to person, place, and time.     Motor: No weakness.     Coordination: Coordination normal.  Psychiatric:        Mood and Affect: Mood normal.     (all labs ordered are listed, but only abnormal results are displayed) Labs Reviewed  RESP PANEL BY RT-PCR (RSV, FLU A&B, COVID)  [id number]    EKG: None  Radiology: DG Chest 2 View Result Date: 05/21/2024 CLINICAL DATA:  Runny nose and cough x1 day. EXAM: CHEST - 2 VIEW COMPARISON:  None Available. FINDINGS: The heart size and mediastinal contours are within normal limits. A 1.3 cm nodular opacity is seen within the posterior medial aspect of the mid left lung, adjacent to the midthoracic spine. No acute infiltrate, pleural effusion or pneumothorax is identified. Multilevel degenerative changes are seen throughout the thoracic spine. IMPRESSION: 1.3 cm nodular opacity within the posterior medial aspect of the mid left lung. Correlation  with nonemergent chest CT is recommended to exclude the presence of an underlying neoplastic process. Electronically Signed   By: Suzen Dials M.D.   On: 05/21/2024 16:05     Procedures   Medications Ordered in the ED - No data to display                                  Medical Decision Making  Patient presents as outlined.  Clinically and by history all symptoms are consistent with influenza.  Patient did test negative for influenza.  However, he was in a party gathering and his wife has tested positive with similar symptoms.  He does not want to take Tamiflu.  His wife was prescribed Tamiflu at another facility and after reviewing the side effects declines to take it.  We discussed taking ibuprofen and Tylenol for fever control.  Patient denies nausea or need for Zofran.  Chest x-ray showed a  incidental 1.3 cm nodule.  I have reviewed this with the patient.  He is aware that this needs follow-up with his PCP and CT imaging to evaluate for possible neoplasm.  Patient reports he will call his PCP Monday to start addressing this.  Patient does not endorse any symptoms of neoplasm.  He has a smoking history but quit 18 years ago.  He denies history of COPD or emphysema.     Final diagnoses:  Influenza-like illness  Lung nodule, solitary    ED Discharge Orders     None          Armenta Canning, MD 05/21/24 1747  "

## 2024-05-21 NOTE — ED Triage Notes (Signed)
 Runny nose cough Started yesterday Sick contact over weekend

## 2024-05-26 NOTE — Progress Notes (Unsigned)
 OFFICE VISIT  05/27/2024  CC: No chief complaint on file.  Patient is a 73 y.o. male who presents for discussion of pulmonary nodule. Pt with ILI, was seen at ED 05/21/24, CXR showed 1.3 cm nodule in L mid lung region.  He declined tamiflu rx recommended for suspected influenza.  HPI: His flulike illness has completely resolved.  Remote hx smoking (approx 20 pack-yr)-->quit approx 15 yrs ago. No abnl wt loss, night sweats, or fevers.  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     Omega-3 Fatty Acids (FISH OIL) 1000 MG CAPS Take by mouth daily.     psyllium (METAMUCIL) 58.6 % powder Take 1 packet by mouth as needed.     Saw Palmetto, Serenoa repens, (SAW PALMETTO PO) Take 2 tablets by mouth daily.     VITAMIN D  PO Take 125 mcg by mouth daily.     No facility-administered medications prior to visit.    Allergies[1]  Review of Systems  As per HPI  PE:    05/27/2024   11:13 AM 05/21/2024    4:32 PM 05/21/2024    2:44 PM  Vitals with BMI  Height 5' 11    Weight 176 lbs    BMI 24.56    Systolic 114 153 860  Diastolic 71 78 86  Pulse 71 98 91     Physical Exam  General: Alert and well-appearing. No further exam today.  LABS:  Last CBC Lab Results  Component Value Date   WBC 7.3 09/02/2023   HGB 13.9 09/02/2023   HCT 40.5 09/02/2023   MCV 89.2 09/02/2023   MCH 30.6 09/02/2023   RDW 12.8 09/02/2023   PLT  225 09/02/2023   Last metabolic panel Lab Results  Component Value Date   GLUCOSE 110 (H) 09/02/2023   NA 137 09/02/2023   K 4.1 09/02/2023   CL 105 09/02/2023   CO2 26 09/02/2023   BUN 14 09/02/2023   CREATININE 0.65 09/02/2023   GFRNONAA >60 09/02/2023   CALCIUM 8.7 (L) 09/02/2023   PROT 6.6 09/02/2023   ALBUMIN 3.9 09/02/2023   BILITOT 0.6 09/02/2023   ALKPHOS 35 (L) 09/02/2023   AST 13 (L) 09/02/2023   ALT 15 09/02/2023   ANIONGAP 6 09/02/2023   IMPRESSION AND PLAN:  #1 influenza-like illness--resolved appropriately.  2.  Solitary pulmonary nodule, 1.3 cm, left lung. He does have a remote smoking history. CT with contrast ordered today to further evaluate.  An After Visit Summary was printed and given to the patient.  FOLLOW UP: Return for TBD.  Signed:  Gerlene Hockey, MD           05/27/2024     [1] No Known Allergies

## 2024-05-27 ENCOUNTER — Ambulatory Visit: Admitting: Family Medicine

## 2024-05-27 ENCOUNTER — Encounter: Payer: Self-pay | Admitting: Family Medicine

## 2024-05-27 VITALS — BP 114/71 | HR 71 | Temp 97.6°F | Ht 71.0 in | Wt 176.0 lb

## 2024-05-27 DIAGNOSIS — R911 Solitary pulmonary nodule: Secondary | ICD-10-CM

## 2024-06-04 ENCOUNTER — Ambulatory Visit (HOSPITAL_BASED_OUTPATIENT_CLINIC_OR_DEPARTMENT_OTHER)
Admission: RE | Admit: 2024-06-04 | Discharge: 2024-06-04 | Disposition: A | Discharge status: Home / Self Care | Source: Ambulatory Visit | Attending: Family Medicine | Admitting: Family Medicine

## 2024-06-04 DIAGNOSIS — R911 Solitary pulmonary nodule: Secondary | ICD-10-CM | POA: Diagnosis present

## 2024-06-04 MED ORDER — IOHEXOL 300 MG/ML  SOLN
75.0000 mL | Freq: Once | INTRAMUSCULAR | Status: AC | PRN
  Administered 2024-06-04: 75 mL via INTRAVENOUS

## 2024-06-09 ENCOUNTER — Telehealth: Payer: Self-pay

## 2024-06-09 NOTE — Telephone Encounter (Signed)
 Patient concerned that he has not heard about any results from chest CT last week. Found nodule around Christmas and is very concerned.  Please call radiology and get report read and sent to Dr. Candise.  Please call patient with results.

## 2024-06-10 ENCOUNTER — Ambulatory Visit: Payer: Self-pay | Admitting: Family Medicine

## 2024-06-10 NOTE — Telephone Encounter (Signed)
 Result available for review.   Please further advise.

## 2024-06-10 NOTE — Telephone Encounter (Signed)
 Primary Information  Source  [name] (Patient)   Subject  [name] (Patient)   Topic  Clinical - Lab/Test Results    Communication  Reason for CRM: Pt returned missed call from CMA. I advised PCP sent him a message in MyChart. Pt asked me to read the message to him since he was out and about. I read message and advised the pt that he can always go back and reference it on MyChart. Pt said to tell PCP thank you for the great news. He had been worried about it since Christmas.   Patient Information  Patient Name Gender DOB SSN  Shannon King, Shannon King Male Aug 05, 1950 [id number]   Contacts  Contact Date/Time Type Contact Phone/Fax  06/10/2024 11:35 AM EST Phone (Incoming) [name], [name] (Self) [phone] (M)

## 2024-06-10 NOTE — Telephone Encounter (Signed)
 LVM for pt to return call regarding results or check MyChart portal.

## 2024-06-10 NOTE — Telephone Encounter (Signed)
 LM for pt to return call regarding results

## 2024-06-10 NOTE — Telephone Encounter (Signed)
 I just sent a MyChart message to him now.  Please call him and make sure he gets the note. The CT scan results were excellent/very reassuring.

## 2024-06-10 NOTE — Telephone Encounter (Signed)
 Primary Information  Source  [name] (Patient)   Subject  [name] (Patient)   Topic  Clinical - Lab/Test Results    Communication  Reason for CRM: Pt returned missed call from CMA. I advised PCP sent him a message in MyChart. Pt asked me to read the message to him since he was out and about. I read message and advised the pt that he can always go back and reference it on MyChart. Pt said to tell PCP thank you for the great news. He had been worried about it since Christmas.   Patient Information  Patient Name Gender DOB SSN  Jemari, Hallum Male Aug 05, 1950 [id number]   Contacts  Contact Date/Time Type Contact Phone/Fax  06/10/2024 11:35 AM EST Phone (Incoming) [name], [name] (Self) [phone] (M)

## 2024-06-26 NOTE — Progress Notes (Signed)
 Not my patient

## 2024-11-19 ENCOUNTER — Encounter: Admitting: Family Medicine
